# Patient Record
Sex: Female | Born: 1960 | Marital: Married | Smoking: Former smoker
Health system: Southern US, Community
[De-identification: ages and names within clinical notes are randomized; demographics above are authoritative.]

## PROBLEM LIST (undated history)

## (undated) DIAGNOSIS — M2550 Pain in unspecified joint: Secondary | ICD-10-CM

## (undated) DIAGNOSIS — K219 Gastro-esophageal reflux disease without esophagitis: Secondary | ICD-10-CM

## (undated) DIAGNOSIS — M459 Ankylosing spondylitis of unspecified sites in spine: Secondary | ICD-10-CM

## (undated) DIAGNOSIS — R1310 Dysphagia, unspecified: Secondary | ICD-10-CM

## (undated) DIAGNOSIS — I10 Essential (primary) hypertension: Secondary | ICD-10-CM

## (undated) DIAGNOSIS — M549 Dorsalgia, unspecified: Secondary | ICD-10-CM

## (undated) DIAGNOSIS — R12 Heartburn: Secondary | ICD-10-CM

## (undated) DIAGNOSIS — R9389 Abnormal findings on diagnostic imaging of other specified body structures: Secondary | ICD-10-CM

## (undated) DIAGNOSIS — Z6841 Body Mass Index (BMI) 40.0 and over, adult: Secondary | ICD-10-CM

## (undated) DIAGNOSIS — M545 Low back pain, unspecified: Secondary | ICD-10-CM

## (undated) DIAGNOSIS — M1990 Unspecified osteoarthritis, unspecified site: Secondary | ICD-10-CM

## (undated) DIAGNOSIS — Z8719 Personal history of other diseases of the digestive system: Secondary | ICD-10-CM

## (undated) DIAGNOSIS — M25569 Pain in unspecified knee: Secondary | ICD-10-CM

## (undated) HISTORY — DX: Body Mass Index (BMI) 40.0 and over, adult: Z684

## (undated) HISTORY — DX: Unspecified osteoarthritis, unspecified site: M19.90

## (undated) HISTORY — DX: Low back pain, unspecified: M54.50

## (undated) HISTORY — DX: Pain in unspecified knee: M25.569

## (undated) HISTORY — DX: Dorsalgia, unspecified: M54.9

## (undated) HISTORY — PX: UMBILICAL EXPLORATION: SHX2596

## (undated) HISTORY — PX: CHOLECYSTECTOMY: SHX55

## (undated) HISTORY — DX: Essential (primary) hypertension: I10

## (undated) HISTORY — DX: Abnormal findings on diagnostic imaging of other specified body structures: R93.89

## (undated) HISTORY — DX: Heartburn: R12

## (undated) HISTORY — DX: Pain in unspecified joint: M25.50

## (undated) HISTORY — DX: Dysphagia, unspecified: R13.10

---

## 1992-04-27 HISTORY — PX: CHOLECYSTECTOMY: SHX55

## 1999-04-28 HISTORY — PX: UMBILICAL HERNIA REPAIR: SHX196

## 2009-03-08 DIAGNOSIS — R300 Dysuria: Secondary | ICD-10-CM | POA: Insufficient documentation

## 2009-03-08 DIAGNOSIS — N921 Excessive and frequent menstruation with irregular cycle: Secondary | ICD-10-CM | POA: Insufficient documentation

## 2009-03-08 DIAGNOSIS — M1712 Unilateral primary osteoarthritis, left knee: Secondary | ICD-10-CM | POA: Insufficient documentation

## 2013-02-20 DIAGNOSIS — J309 Allergic rhinitis, unspecified: Secondary | ICD-10-CM | POA: Insufficient documentation

## 2013-02-20 DIAGNOSIS — J329 Chronic sinusitis, unspecified: Secondary | ICD-10-CM | POA: Insufficient documentation

## 2013-11-09 DIAGNOSIS — G629 Polyneuropathy, unspecified: Secondary | ICD-10-CM | POA: Insufficient documentation

## 2013-11-09 DIAGNOSIS — M545 Low back pain, unspecified: Secondary | ICD-10-CM | POA: Insufficient documentation

## 2013-11-09 DIAGNOSIS — G8929 Other chronic pain: Secondary | ICD-10-CM | POA: Insufficient documentation

## 2013-11-09 DIAGNOSIS — Z833 Family history of diabetes mellitus: Secondary | ICD-10-CM | POA: Insufficient documentation

## 2014-05-31 DIAGNOSIS — M25562 Pain in left knee: Secondary | ICD-10-CM | POA: Insufficient documentation

## 2014-05-31 DIAGNOSIS — Z634 Disappearance and death of family member: Secondary | ICD-10-CM | POA: Insufficient documentation

## 2017-03-16 DIAGNOSIS — M9902 Segmental and somatic dysfunction of thoracic region: Secondary | ICD-10-CM | POA: Diagnosis not present

## 2017-03-16 DIAGNOSIS — M9905 Segmental and somatic dysfunction of pelvic region: Secondary | ICD-10-CM | POA: Diagnosis not present

## 2017-03-16 DIAGNOSIS — M2140 Flat foot [pes planus] (acquired), unspecified foot: Secondary | ICD-10-CM | POA: Diagnosis not present

## 2017-03-16 DIAGNOSIS — M9903 Segmental and somatic dysfunction of lumbar region: Secondary | ICD-10-CM | POA: Diagnosis not present

## 2017-03-31 DIAGNOSIS — M62830 Muscle spasm of back: Secondary | ICD-10-CM | POA: Diagnosis not present

## 2017-03-31 DIAGNOSIS — M9902 Segmental and somatic dysfunction of thoracic region: Secondary | ICD-10-CM | POA: Diagnosis not present

## 2017-03-31 DIAGNOSIS — M9904 Segmental and somatic dysfunction of sacral region: Secondary | ICD-10-CM | POA: Diagnosis not present

## 2017-03-31 DIAGNOSIS — M9901 Segmental and somatic dysfunction of cervical region: Secondary | ICD-10-CM | POA: Diagnosis not present

## 2017-04-09 DIAGNOSIS — L738 Other specified follicular disorders: Secondary | ICD-10-CM | POA: Diagnosis not present

## 2017-04-09 DIAGNOSIS — L509 Urticaria, unspecified: Secondary | ICD-10-CM | POA: Diagnosis not present

## 2017-05-24 DIAGNOSIS — M9904 Segmental and somatic dysfunction of sacral region: Secondary | ICD-10-CM | POA: Diagnosis not present

## 2017-05-24 DIAGNOSIS — M5417 Radiculopathy, lumbosacral region: Secondary | ICD-10-CM | POA: Diagnosis not present

## 2017-05-24 DIAGNOSIS — M9903 Segmental and somatic dysfunction of lumbar region: Secondary | ICD-10-CM | POA: Diagnosis not present

## 2017-05-24 DIAGNOSIS — M9901 Segmental and somatic dysfunction of cervical region: Secondary | ICD-10-CM | POA: Diagnosis not present

## 2017-06-01 DIAGNOSIS — I10 Essential (primary) hypertension: Secondary | ICD-10-CM | POA: Diagnosis not present

## 2017-06-01 DIAGNOSIS — M549 Dorsalgia, unspecified: Secondary | ICD-10-CM | POA: Diagnosis not present

## 2017-06-01 DIAGNOSIS — Z1211 Encounter for screening for malignant neoplasm of colon: Secondary | ICD-10-CM | POA: Diagnosis not present

## 2017-10-15 DIAGNOSIS — Z0000 Encounter for general adult medical examination without abnormal findings: Secondary | ICD-10-CM | POA: Diagnosis not present

## 2017-10-15 DIAGNOSIS — I10 Essential (primary) hypertension: Secondary | ICD-10-CM | POA: Diagnosis not present

## 2017-10-15 DIAGNOSIS — Z136 Encounter for screening for cardiovascular disorders: Secondary | ICD-10-CM | POA: Diagnosis not present

## 2018-03-22 DIAGNOSIS — J0100 Acute maxillary sinusitis, unspecified: Secondary | ICD-10-CM | POA: Diagnosis not present

## 2018-05-04 DIAGNOSIS — Z1211 Encounter for screening for malignant neoplasm of colon: Secondary | ICD-10-CM | POA: Diagnosis not present

## 2018-05-04 DIAGNOSIS — Z6841 Body Mass Index (BMI) 40.0 and over, adult: Secondary | ICD-10-CM | POA: Diagnosis not present

## 2018-05-04 DIAGNOSIS — M549 Dorsalgia, unspecified: Secondary | ICD-10-CM | POA: Diagnosis not present

## 2018-05-04 DIAGNOSIS — I10 Essential (primary) hypertension: Secondary | ICD-10-CM | POA: Diagnosis not present

## 2018-05-23 DIAGNOSIS — Z01419 Encounter for gynecological examination (general) (routine) without abnormal findings: Secondary | ICD-10-CM | POA: Diagnosis not present

## 2018-05-23 DIAGNOSIS — Z113 Encounter for screening for infections with a predominantly sexual mode of transmission: Secondary | ICD-10-CM | POA: Diagnosis not present

## 2018-05-23 DIAGNOSIS — Z1231 Encounter for screening mammogram for malignant neoplasm of breast: Secondary | ICD-10-CM | POA: Diagnosis not present

## 2018-05-23 DIAGNOSIS — Z6841 Body Mass Index (BMI) 40.0 and over, adult: Secondary | ICD-10-CM | POA: Diagnosis not present

## 2018-05-23 DIAGNOSIS — Z1151 Encounter for screening for human papillomavirus (HPV): Secondary | ICD-10-CM | POA: Diagnosis not present

## 2018-06-06 DIAGNOSIS — N950 Postmenopausal bleeding: Secondary | ICD-10-CM | POA: Diagnosis not present

## 2018-06-06 DIAGNOSIS — R9389 Abnormal findings on diagnostic imaging of other specified body structures: Secondary | ICD-10-CM | POA: Diagnosis not present

## 2018-07-11 DIAGNOSIS — M40202 Unspecified kyphosis, cervical region: Secondary | ICD-10-CM | POA: Diagnosis not present

## 2018-07-11 DIAGNOSIS — M47812 Spondylosis without myelopathy or radiculopathy, cervical region: Secondary | ICD-10-CM | POA: Diagnosis not present

## 2018-07-11 DIAGNOSIS — M5032 Other cervical disc degeneration, mid-cervical region, unspecified level: Secondary | ICD-10-CM | POA: Diagnosis not present

## 2018-07-11 DIAGNOSIS — M4726 Other spondylosis with radiculopathy, lumbar region: Secondary | ICD-10-CM | POA: Diagnosis not present

## 2018-07-13 DIAGNOSIS — M47812 Spondylosis without myelopathy or radiculopathy, cervical region: Secondary | ICD-10-CM | POA: Diagnosis not present

## 2018-07-13 DIAGNOSIS — M4726 Other spondylosis with radiculopathy, lumbar region: Secondary | ICD-10-CM | POA: Diagnosis not present

## 2018-07-13 DIAGNOSIS — M40202 Unspecified kyphosis, cervical region: Secondary | ICD-10-CM | POA: Diagnosis not present

## 2018-07-13 DIAGNOSIS — M5032 Other cervical disc degeneration, mid-cervical region, unspecified level: Secondary | ICD-10-CM | POA: Diagnosis not present

## 2018-07-14 DIAGNOSIS — M4726 Other spondylosis with radiculopathy, lumbar region: Secondary | ICD-10-CM | POA: Diagnosis not present

## 2018-07-14 DIAGNOSIS — M5032 Other cervical disc degeneration, mid-cervical region, unspecified level: Secondary | ICD-10-CM | POA: Diagnosis not present

## 2018-07-14 DIAGNOSIS — M40202 Unspecified kyphosis, cervical region: Secondary | ICD-10-CM | POA: Diagnosis not present

## 2018-07-14 DIAGNOSIS — M47812 Spondylosis without myelopathy or radiculopathy, cervical region: Secondary | ICD-10-CM | POA: Diagnosis not present

## 2018-07-18 DIAGNOSIS — M4726 Other spondylosis with radiculopathy, lumbar region: Secondary | ICD-10-CM | POA: Diagnosis not present

## 2018-07-18 DIAGNOSIS — M5032 Other cervical disc degeneration, mid-cervical region, unspecified level: Secondary | ICD-10-CM | POA: Diagnosis not present

## 2018-07-18 DIAGNOSIS — M47812 Spondylosis without myelopathy or radiculopathy, cervical region: Secondary | ICD-10-CM | POA: Diagnosis not present

## 2018-07-18 DIAGNOSIS — M40202 Unspecified kyphosis, cervical region: Secondary | ICD-10-CM | POA: Diagnosis not present

## 2018-07-20 DIAGNOSIS — M47812 Spondylosis without myelopathy or radiculopathy, cervical region: Secondary | ICD-10-CM | POA: Diagnosis not present

## 2018-07-20 DIAGNOSIS — M5032 Other cervical disc degeneration, mid-cervical region, unspecified level: Secondary | ICD-10-CM | POA: Diagnosis not present

## 2018-07-20 DIAGNOSIS — M4726 Other spondylosis with radiculopathy, lumbar region: Secondary | ICD-10-CM | POA: Diagnosis not present

## 2018-07-20 DIAGNOSIS — M40202 Unspecified kyphosis, cervical region: Secondary | ICD-10-CM | POA: Diagnosis not present

## 2018-07-21 DIAGNOSIS — M4726 Other spondylosis with radiculopathy, lumbar region: Secondary | ICD-10-CM | POA: Diagnosis not present

## 2018-07-21 DIAGNOSIS — M47812 Spondylosis without myelopathy or radiculopathy, cervical region: Secondary | ICD-10-CM | POA: Diagnosis not present

## 2018-07-21 DIAGNOSIS — M40202 Unspecified kyphosis, cervical region: Secondary | ICD-10-CM | POA: Diagnosis not present

## 2018-07-21 DIAGNOSIS — M5032 Other cervical disc degeneration, mid-cervical region, unspecified level: Secondary | ICD-10-CM | POA: Diagnosis not present

## 2018-07-25 DIAGNOSIS — M4726 Other spondylosis with radiculopathy, lumbar region: Secondary | ICD-10-CM | POA: Diagnosis not present

## 2018-07-25 DIAGNOSIS — M40202 Unspecified kyphosis, cervical region: Secondary | ICD-10-CM | POA: Diagnosis not present

## 2018-07-25 DIAGNOSIS — M5032 Other cervical disc degeneration, mid-cervical region, unspecified level: Secondary | ICD-10-CM | POA: Diagnosis not present

## 2018-07-25 DIAGNOSIS — M47812 Spondylosis without myelopathy or radiculopathy, cervical region: Secondary | ICD-10-CM | POA: Diagnosis not present

## 2018-07-27 DIAGNOSIS — M47812 Spondylosis without myelopathy or radiculopathy, cervical region: Secondary | ICD-10-CM | POA: Diagnosis not present

## 2018-07-27 DIAGNOSIS — M4726 Other spondylosis with radiculopathy, lumbar region: Secondary | ICD-10-CM | POA: Diagnosis not present

## 2018-07-27 DIAGNOSIS — M5032 Other cervical disc degeneration, mid-cervical region, unspecified level: Secondary | ICD-10-CM | POA: Diagnosis not present

## 2018-07-27 DIAGNOSIS — M40202 Unspecified kyphosis, cervical region: Secondary | ICD-10-CM | POA: Diagnosis not present

## 2018-07-28 DIAGNOSIS — M4726 Other spondylosis with radiculopathy, lumbar region: Secondary | ICD-10-CM | POA: Diagnosis not present

## 2018-07-28 DIAGNOSIS — M5032 Other cervical disc degeneration, mid-cervical region, unspecified level: Secondary | ICD-10-CM | POA: Diagnosis not present

## 2018-07-28 DIAGNOSIS — M47812 Spondylosis without myelopathy or radiculopathy, cervical region: Secondary | ICD-10-CM | POA: Diagnosis not present

## 2018-07-28 DIAGNOSIS — M40202 Unspecified kyphosis, cervical region: Secondary | ICD-10-CM | POA: Diagnosis not present

## 2018-08-01 DIAGNOSIS — M40202 Unspecified kyphosis, cervical region: Secondary | ICD-10-CM | POA: Diagnosis not present

## 2018-08-01 DIAGNOSIS — M4726 Other spondylosis with radiculopathy, lumbar region: Secondary | ICD-10-CM | POA: Diagnosis not present

## 2018-08-01 DIAGNOSIS — M47812 Spondylosis without myelopathy or radiculopathy, cervical region: Secondary | ICD-10-CM | POA: Diagnosis not present

## 2018-08-01 DIAGNOSIS — M5032 Other cervical disc degeneration, mid-cervical region, unspecified level: Secondary | ICD-10-CM | POA: Diagnosis not present

## 2018-08-03 DIAGNOSIS — M4726 Other spondylosis with radiculopathy, lumbar region: Secondary | ICD-10-CM | POA: Diagnosis not present

## 2018-08-03 DIAGNOSIS — M47812 Spondylosis without myelopathy or radiculopathy, cervical region: Secondary | ICD-10-CM | POA: Diagnosis not present

## 2018-08-03 DIAGNOSIS — M40202 Unspecified kyphosis, cervical region: Secondary | ICD-10-CM | POA: Diagnosis not present

## 2018-08-03 DIAGNOSIS — M5032 Other cervical disc degeneration, mid-cervical region, unspecified level: Secondary | ICD-10-CM | POA: Diagnosis not present

## 2018-08-04 DIAGNOSIS — M4726 Other spondylosis with radiculopathy, lumbar region: Secondary | ICD-10-CM | POA: Diagnosis not present

## 2018-08-04 DIAGNOSIS — M47812 Spondylosis without myelopathy or radiculopathy, cervical region: Secondary | ICD-10-CM | POA: Diagnosis not present

## 2018-08-04 DIAGNOSIS — M5032 Other cervical disc degeneration, mid-cervical region, unspecified level: Secondary | ICD-10-CM | POA: Diagnosis not present

## 2018-08-04 DIAGNOSIS — M40202 Unspecified kyphosis, cervical region: Secondary | ICD-10-CM | POA: Diagnosis not present

## 2018-08-09 DIAGNOSIS — M5032 Other cervical disc degeneration, mid-cervical region, unspecified level: Secondary | ICD-10-CM | POA: Diagnosis not present

## 2018-08-09 DIAGNOSIS — M40202 Unspecified kyphosis, cervical region: Secondary | ICD-10-CM | POA: Diagnosis not present

## 2018-08-09 DIAGNOSIS — M4726 Other spondylosis with radiculopathy, lumbar region: Secondary | ICD-10-CM | POA: Diagnosis not present

## 2018-08-09 DIAGNOSIS — M47812 Spondylosis without myelopathy or radiculopathy, cervical region: Secondary | ICD-10-CM | POA: Diagnosis not present

## 2018-08-11 DIAGNOSIS — M47812 Spondylosis without myelopathy or radiculopathy, cervical region: Secondary | ICD-10-CM | POA: Diagnosis not present

## 2018-08-11 DIAGNOSIS — M5032 Other cervical disc degeneration, mid-cervical region, unspecified level: Secondary | ICD-10-CM | POA: Diagnosis not present

## 2018-08-11 DIAGNOSIS — M40202 Unspecified kyphosis, cervical region: Secondary | ICD-10-CM | POA: Diagnosis not present

## 2018-08-11 DIAGNOSIS — M4726 Other spondylosis with radiculopathy, lumbar region: Secondary | ICD-10-CM | POA: Diagnosis not present

## 2018-08-16 DIAGNOSIS — M4726 Other spondylosis with radiculopathy, lumbar region: Secondary | ICD-10-CM | POA: Diagnosis not present

## 2018-08-16 DIAGNOSIS — M5032 Other cervical disc degeneration, mid-cervical region, unspecified level: Secondary | ICD-10-CM | POA: Diagnosis not present

## 2018-08-16 DIAGNOSIS — M40202 Unspecified kyphosis, cervical region: Secondary | ICD-10-CM | POA: Diagnosis not present

## 2018-08-16 DIAGNOSIS — M47812 Spondylosis without myelopathy or radiculopathy, cervical region: Secondary | ICD-10-CM | POA: Diagnosis not present

## 2018-08-18 DIAGNOSIS — M47812 Spondylosis without myelopathy or radiculopathy, cervical region: Secondary | ICD-10-CM | POA: Diagnosis not present

## 2018-08-18 DIAGNOSIS — M40202 Unspecified kyphosis, cervical region: Secondary | ICD-10-CM | POA: Diagnosis not present

## 2018-08-18 DIAGNOSIS — M4726 Other spondylosis with radiculopathy, lumbar region: Secondary | ICD-10-CM | POA: Diagnosis not present

## 2018-08-18 DIAGNOSIS — M5032 Other cervical disc degeneration, mid-cervical region, unspecified level: Secondary | ICD-10-CM | POA: Diagnosis not present

## 2018-08-23 DIAGNOSIS — M5032 Other cervical disc degeneration, mid-cervical region, unspecified level: Secondary | ICD-10-CM | POA: Diagnosis not present

## 2018-08-23 DIAGNOSIS — M4726 Other spondylosis with radiculopathy, lumbar region: Secondary | ICD-10-CM | POA: Diagnosis not present

## 2018-08-23 DIAGNOSIS — M40202 Unspecified kyphosis, cervical region: Secondary | ICD-10-CM | POA: Diagnosis not present

## 2018-08-23 DIAGNOSIS — M47812 Spondylosis without myelopathy or radiculopathy, cervical region: Secondary | ICD-10-CM | POA: Diagnosis not present

## 2018-08-25 DIAGNOSIS — M4726 Other spondylosis with radiculopathy, lumbar region: Secondary | ICD-10-CM | POA: Diagnosis not present

## 2018-08-25 DIAGNOSIS — M47812 Spondylosis without myelopathy or radiculopathy, cervical region: Secondary | ICD-10-CM | POA: Diagnosis not present

## 2018-08-25 DIAGNOSIS — M5032 Other cervical disc degeneration, mid-cervical region, unspecified level: Secondary | ICD-10-CM | POA: Diagnosis not present

## 2018-08-25 DIAGNOSIS — M40202 Unspecified kyphosis, cervical region: Secondary | ICD-10-CM | POA: Diagnosis not present

## 2018-08-31 DIAGNOSIS — M40202 Unspecified kyphosis, cervical region: Secondary | ICD-10-CM | POA: Diagnosis not present

## 2018-08-31 DIAGNOSIS — M5032 Other cervical disc degeneration, mid-cervical region, unspecified level: Secondary | ICD-10-CM | POA: Diagnosis not present

## 2018-08-31 DIAGNOSIS — M47812 Spondylosis without myelopathy or radiculopathy, cervical region: Secondary | ICD-10-CM | POA: Diagnosis not present

## 2018-08-31 DIAGNOSIS — M4726 Other spondylosis with radiculopathy, lumbar region: Secondary | ICD-10-CM | POA: Diagnosis not present

## 2018-11-02 DIAGNOSIS — R9389 Abnormal findings on diagnostic imaging of other specified body structures: Secondary | ICD-10-CM | POA: Diagnosis not present

## 2018-11-02 DIAGNOSIS — N950 Postmenopausal bleeding: Secondary | ICD-10-CM | POA: Diagnosis not present

## 2018-11-04 DIAGNOSIS — Z0000 Encounter for general adult medical examination without abnormal findings: Secondary | ICD-10-CM | POA: Diagnosis not present

## 2019-01-30 DIAGNOSIS — T7840XA Allergy, unspecified, initial encounter: Secondary | ICD-10-CM | POA: Diagnosis not present

## 2019-01-30 DIAGNOSIS — R062 Wheezing: Secondary | ICD-10-CM | POA: Diagnosis not present

## 2019-02-24 ENCOUNTER — Other Ambulatory Visit: Payer: Self-pay

## 2019-02-24 ENCOUNTER — Encounter: Payer: Self-pay | Admitting: Allergy

## 2019-02-24 ENCOUNTER — Ambulatory Visit (INDEPENDENT_AMBULATORY_CARE_PROVIDER_SITE_OTHER): Payer: BC Managed Care – PPO | Admitting: Allergy

## 2019-02-24 VITALS — BP 132/80 | HR 88 | Temp 97.7°F | Resp 16 | Ht 62.5 in | Wt 284.2 lb

## 2019-02-24 DIAGNOSIS — H1013 Acute atopic conjunctivitis, bilateral: Secondary | ICD-10-CM | POA: Diagnosis not present

## 2019-02-24 DIAGNOSIS — J3089 Other allergic rhinitis: Secondary | ICD-10-CM | POA: Diagnosis not present

## 2019-02-24 MED ORDER — AZELASTINE HCL 0.1 % NA SOLN: 2.0000 | Freq: Two times a day (BID) | NASAL | 5 refills | Status: DC

## 2019-02-24 MED ORDER — MONTELUKAST SODIUM 10 MG PO TABS: 10.0000 mg | ORAL_TABLET | Freq: Every day | ORAL | 5 refills | Status: DC

## 2019-02-24 MED ORDER — LEVOCETIRIZINE DIHYDROCHLORIDE 5 MG PO TABS: 5.0000 mg | ORAL_TABLET | Freq: Every evening | ORAL | 5 refills | Status: DC

## 2019-02-24 MED ORDER — OLOPATADINE HCL 0.1 % OP SOLN: 1.0000 [drp] | Freq: Two times a day (BID) | OPHTHALMIC | 5 refills | Status: DC

## 2019-02-24 NOTE — Progress Notes (Signed)
New Patient Note  RE: Kelli Simmons MRN: [id number] DOB: December 18, 1960 Date of Office Visit: 02/24/2019  Referring provider: Farris HasMorrow, Aaron, MD Primary care provider: Farris HasMorrow, Aaron, MD  Chief Complaint: allergies  History of present illness: Kelli Simmons is a 58 y.o. female presenting today for consultation for allergies.    She reports feeling lightheaded and pressure in her forehead and left ear itchiness, pressure around her eyes with red eyes, fatigue, cough, some runny nose and congestion, throat clearing, sneezing.  No SOB or chest tightness.   Symptoms worse spring.  She also notes symptoms worse when around fresh cut grass as well as ragweed exposure.  She feels Dec/Jan she notices increased symptoms as well.    She has tried use of nasal saline rinse but it starting burning her nose thus she stopped.   She now just uses a nasal steam device.   She was taking nasacort while in [place] but was advised not to use due to her HTN.  She states she scaled back on using medicated nasal sprays.  She started taking supplements for sinus support and allergies which she does felt helps some but then they too stop working after a period of time.   She used to take Allegra but after a period of taking it she felt it was making her dizzy.    She recently had a course of Augmentin this fall for presumed sinus infection.    She moved to this area from [place] 2 years ago.  She states while living in [place] she did see an allergist but states she never had any testing done.  She has an albuterol inhaler but states has been advised to only use if she has coughing with a "tickle in her throat".  She states she feels like there is an "air pocket" in the back of her throat that will cause her to cough.  She states she then tries to inhale deeply to resolve the air pocket sensation but this makes her feel like she is having trouble breathing thru her nose and mouth.  She states she then needs to calm herself  down which takes several minutes to calm down.   She states she has not used the albuterol inhaler yet.  She was also prescribed an Epipen as well for these episodes.  She states the epipen was very expensive thus did not get from the pharmacy.   She stopped eating dairy, wheat and soy in the diet about a year ago.  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  She took the foods out the diet and states the rash resolved.    She is a vegetarian.   No history of eczema or asthma.     Review of systems: Review of Systems  Constitutional: Positive for fever and malaise/fatigue. Negative for chills.  HENT: Positive for congestion. Negative for ear discharge, ear pain, nosebleeds and sore throat.   Eyes: Positive for redness. Negative for pain and discharge.  Respiratory: Positive for cough. Negative for sputum production, shortness of breath and wheezing.   Cardiovascular: Negative.   Gastrointestinal: Negative.   Musculoskeletal: Negative.   Skin: Negative for itching and rash.  Neurological: Positive for dizziness and headaches.    All other systems negative unless noted above in HPI  Past medical history: Past Medical History:  Diagnosis Date   Back pain    BMI 50.0-59.9, adult (HCC)    Endometrial thickening on  ultrasound    Hypertension     Past surgical history: History reviewed. No pertinent surgical history.  Family history:  Family History  Problem Relation Age of Onset   Asthma Neg Hx    Eczema Neg Hx    Allergic Disorder Neg Hx     Social history: She lives in a home with carpeting in the bedroom with gas heating and central cooling.  There is no pets in the home.  There is no concern for water damage, mildew averages in the home.  She is unemployed. Tobacco Use   Smoking status: Former Smoker    Packs/day: 2.00    Years: 16.00    Pack years: 32.00    Types: Cigarettes    Quit date: 04/27/1988     Years since quitting: 30.8   Smokeless tobacco: Never Used    Medication List: Current Outpatient Medications  Medication Sig Dispense Refill   acetaminophen (TYLENOL) 500 MG tablet Take by mouth.     amLODipine (NORVASC) 5 MG tablet Take 5 mg by mouth daily.     Omega-3 Fatty Acids (OMEGA-3 EPA FISH OIL PO) Take by mouth.     progesterone (PROMETRIUM) 100 MG capsule Take 100 mg by mouth at bedtime.     traMADol (ULTRAM) 50 MG tablet Take 50 mg by mouth every 6 (six) hours as needed.     albuterol (VENTOLIN HFA) 108 (90 Base) MCG/ACT inhaler INHALE 1 2 PUFFS BY MOUTH AS NEEDED FOR WHEEZING EVERY 6 HRS INHALATION     No current facility-administered medications for this visit.     Known medication allergies: No Known Allergies   Physical examination: Blood pressure 132/80, pulse 88, temperature 97.7 F (36.5 C), temperature source Temporal, resp. rate 16, height 5' 2.5" (1.588 m), weight 284 lb 3.2 oz (128.9 kg), SpO2 99 %.  General: Alert, interactive, in no acute distress. HEENT: PERRLA, TMs pearly gray, turbinates moderately edematous without discharge, post-pharynx non erythematous. Neck: Supple without lymphadenopathy. Lungs: Clear to auscultation without wheezing, rhonchi or rales. {no increased work of breathing. CV: Normal S1, S2 without murmurs. Abdomen: Nondistended, nontender. Skin: Warm and dry, without lesions or rashes. Extremities:  No clubbing, cyanosis or edema. Neuro:   Grossly intact.  Diagnositics/Labs:  Allergy testing: Environmental allergy skin prick testing is positive to sycamore, walnut tree pollen, cockroach, mucor plumbeus. Intradermal testing is negative. Skin prick testing to common food allergens are negative. Allergy testing results were read and interpreted by provider, documented by clinical staff.   Assessment and plan:   Allergic rhinitis with conjunctivitis - causing significant allergy symptoms including lightheaded, dizziness,  sinus pressure, HA, itching, cough, nasal drainage, sneezing, red eyes and puffiness - common food allergy testing is negative - environmental allergy skin testing today is positive to tree pollen, mold and cockroach - allergen avoidance measures discussed/handouts provided - for control of nasal drainage recommend use of nasal antihistamine, Astelin 2 sprays each nostril twice a day.  - use nasal sprays with proper technique with pointing the tip of bottle towards the ear on same side nostril - for red/itchy/watery eyes use Patanol 1 drop each eye twice daily as needed - recommend trial of Xyzal 5mg  daily.   This is a long-acting antihistamine.  - recommend starting Singulair 10mg  daily.  This blocks leukotrienes and can provide additional allergy symptom control alongside your antihistamine.   If you note any mood or behavior changes after starting Singulair then stop this medication and symptoms return to baseline; let  us know if this occurs.   - allergen immunotherapy discussed today including protocol, benefits and risk.  Informational handout provided.  If interested in this therapuetic option you can check with your insurance carrier for coverage.  Let us know if you would like to proceed with this option.    Follow-up 4-6 months or sooner if needed   I appreciate the opportunity to take part in Shalona's care. Please do not hesitate to contact me with questions.  Sincerely,   Margo Aye, MD Allergy/Immunology [facility]

## 2019-02-24 NOTE — Patient Instructions (Addendum)
Allergies - causing significant allergy symptoms including lightheaded, dizziness, sinus pressure, HA, itching, cough, nasal drainage, sneezing, red eyes and puffiness - common food allergy testing is negative - environmental allergy skin testing today is positive to tree pollen, mold and cockroach - allergen avoidance measures discussed/handouts provided - for control of nasal drainage recommend use of nasal antihistamine, Astelin 2 sprays each nostril twice a day.  - use nasal sprays with proper technique with pointing the tip of bottle towards the ear on same side nostril - for red/itchy/watery eyes use Patanol 1 drop each eye twice daily as needed - recommend trial of Xyzal 5mg  daily.   This is a long-acting antihistamine.  - recommend starting Singulair 10mg  daily.  This blocks leukotrienes and can provide additional allergy symptom control alongside your antihistamine.   If you note any mood or behavior changes after starting Singulair then stop this medication and symptoms return to baseline; let us know if this occurs.   - allergen immunotherapy discussed today including protocol, benefits and risk.  Informational handout provided.  If interested in this therapuetic option you can check with your insurance carrier for coverage.  Let us know if you would like to proceed with this option.    Follow-up 4-6 months or sooner if needed

## 2019-02-27 NOTE — Addendum Note (Signed)
Addended by: Cathi Roan on: 02/27/2019 03:11 PM   Modules accepted: Orders

## 2019-04-06 DIAGNOSIS — I10 Essential (primary) hypertension: Secondary | ICD-10-CM | POA: Diagnosis not present

## 2019-04-06 DIAGNOSIS — Z13220 Encounter for screening for lipoid disorders: Secondary | ICD-10-CM | POA: Diagnosis not present

## 2019-04-06 DIAGNOSIS — R7309 Other abnormal glucose: Secondary | ICD-10-CM | POA: Diagnosis not present

## 2019-04-09 ENCOUNTER — Other Ambulatory Visit: Payer: Self-pay | Admitting: Allergy

## 2019-05-19 DIAGNOSIS — R0981 Nasal congestion: Secondary | ICD-10-CM | POA: Diagnosis not present

## 2019-05-19 DIAGNOSIS — Z20828 Contact with and (suspected) exposure to other viral communicable diseases: Secondary | ICD-10-CM | POA: Diagnosis not present

## 2019-05-20 DIAGNOSIS — Z20828 Contact with and (suspected) exposure to other viral communicable diseases: Secondary | ICD-10-CM | POA: Diagnosis not present

## 2019-05-25 DIAGNOSIS — I10 Essential (primary) hypertension: Secondary | ICD-10-CM | POA: Diagnosis not present

## 2019-05-25 DIAGNOSIS — M549 Dorsalgia, unspecified: Secondary | ICD-10-CM | POA: Diagnosis not present

## 2019-06-08 DIAGNOSIS — Z23 Encounter for immunization: Secondary | ICD-10-CM | POA: Diagnosis not present

## 2019-06-08 DIAGNOSIS — R7309 Other abnormal glucose: Secondary | ICD-10-CM | POA: Diagnosis not present

## 2019-06-08 DIAGNOSIS — Z13220 Encounter for screening for lipoid disorders: Secondary | ICD-10-CM | POA: Diagnosis not present

## 2019-06-08 DIAGNOSIS — I10 Essential (primary) hypertension: Secondary | ICD-10-CM | POA: Diagnosis not present

## 2019-06-21 DIAGNOSIS — Z1211 Encounter for screening for malignant neoplasm of colon: Secondary | ICD-10-CM | POA: Diagnosis not present

## 2019-06-29 ENCOUNTER — Ambulatory Visit: Payer: BC Managed Care – PPO | Admitting: Allergy

## 2019-06-30 DIAGNOSIS — Z1231 Encounter for screening mammogram for malignant neoplasm of breast: Secondary | ICD-10-CM | POA: Diagnosis not present

## 2019-06-30 DIAGNOSIS — Z01419 Encounter for gynecological examination (general) (routine) without abnormal findings: Secondary | ICD-10-CM | POA: Diagnosis not present

## 2019-06-30 DIAGNOSIS — Z6841 Body Mass Index (BMI) 40.0 and over, adult: Secondary | ICD-10-CM | POA: Diagnosis not present

## 2019-07-05 DIAGNOSIS — M9901 Segmental and somatic dysfunction of cervical region: Secondary | ICD-10-CM | POA: Diagnosis not present

## 2019-07-05 DIAGNOSIS — M9905 Segmental and somatic dysfunction of pelvic region: Secondary | ICD-10-CM | POA: Diagnosis not present

## 2019-07-05 DIAGNOSIS — M9902 Segmental and somatic dysfunction of thoracic region: Secondary | ICD-10-CM | POA: Diagnosis not present

## 2019-07-05 DIAGNOSIS — M9903 Segmental and somatic dysfunction of lumbar region: Secondary | ICD-10-CM | POA: Diagnosis not present

## 2019-07-07 DIAGNOSIS — M4316 Spondylolisthesis, lumbar region: Secondary | ICD-10-CM | POA: Diagnosis not present

## 2019-07-07 DIAGNOSIS — M549 Dorsalgia, unspecified: Secondary | ICD-10-CM | POA: Diagnosis not present

## 2019-07-07 DIAGNOSIS — M47812 Spondylosis without myelopathy or radiculopathy, cervical region: Secondary | ICD-10-CM | POA: Diagnosis not present

## 2019-07-07 DIAGNOSIS — M160 Bilateral primary osteoarthritis of hip: Secondary | ICD-10-CM | POA: Diagnosis not present

## 2019-07-07 DIAGNOSIS — M461 Sacroiliitis, not elsewhere classified: Secondary | ICD-10-CM | POA: Diagnosis not present

## 2019-07-07 DIAGNOSIS — M47819 Spondylosis without myelopathy or radiculopathy, site unspecified: Secondary | ICD-10-CM | POA: Diagnosis not present

## 2019-07-07 DIAGNOSIS — M47816 Spondylosis without myelopathy or radiculopathy, lumbar region: Secondary | ICD-10-CM | POA: Diagnosis not present

## 2019-07-07 DIAGNOSIS — M47814 Spondylosis without myelopathy or radiculopathy, thoracic region: Secondary | ICD-10-CM | POA: Diagnosis not present

## 2019-07-07 DIAGNOSIS — M2578 Osteophyte, vertebrae: Secondary | ICD-10-CM | POA: Diagnosis not present

## 2019-07-11 DIAGNOSIS — M9905 Segmental and somatic dysfunction of pelvic region: Secondary | ICD-10-CM | POA: Diagnosis not present

## 2019-07-11 DIAGNOSIS — M9901 Segmental and somatic dysfunction of cervical region: Secondary | ICD-10-CM | POA: Diagnosis not present

## 2019-07-11 DIAGNOSIS — M9902 Segmental and somatic dysfunction of thoracic region: Secondary | ICD-10-CM | POA: Diagnosis not present

## 2019-07-11 DIAGNOSIS — M9903 Segmental and somatic dysfunction of lumbar region: Secondary | ICD-10-CM | POA: Diagnosis not present

## 2019-07-17 DIAGNOSIS — M9902 Segmental and somatic dysfunction of thoracic region: Secondary | ICD-10-CM | POA: Diagnosis not present

## 2019-07-17 DIAGNOSIS — M9905 Segmental and somatic dysfunction of pelvic region: Secondary | ICD-10-CM | POA: Diagnosis not present

## 2019-07-17 DIAGNOSIS — M9903 Segmental and somatic dysfunction of lumbar region: Secondary | ICD-10-CM | POA: Diagnosis not present

## 2019-07-17 DIAGNOSIS — M9901 Segmental and somatic dysfunction of cervical region: Secondary | ICD-10-CM | POA: Diagnosis not present

## 2019-07-25 DIAGNOSIS — M9905 Segmental and somatic dysfunction of pelvic region: Secondary | ICD-10-CM | POA: Diagnosis not present

## 2019-07-25 DIAGNOSIS — M9903 Segmental and somatic dysfunction of lumbar region: Secondary | ICD-10-CM | POA: Diagnosis not present

## 2019-07-25 DIAGNOSIS — M9902 Segmental and somatic dysfunction of thoracic region: Secondary | ICD-10-CM | POA: Diagnosis not present

## 2019-07-25 DIAGNOSIS — M9901 Segmental and somatic dysfunction of cervical region: Secondary | ICD-10-CM | POA: Diagnosis not present

## 2019-08-01 DIAGNOSIS — M9901 Segmental and somatic dysfunction of cervical region: Secondary | ICD-10-CM | POA: Diagnosis not present

## 2019-08-01 DIAGNOSIS — M9903 Segmental and somatic dysfunction of lumbar region: Secondary | ICD-10-CM | POA: Diagnosis not present

## 2019-08-01 DIAGNOSIS — M9902 Segmental and somatic dysfunction of thoracic region: Secondary | ICD-10-CM | POA: Diagnosis not present

## 2019-08-01 DIAGNOSIS — M9905 Segmental and somatic dysfunction of pelvic region: Secondary | ICD-10-CM | POA: Diagnosis not present

## 2019-08-08 DIAGNOSIS — M9905 Segmental and somatic dysfunction of pelvic region: Secondary | ICD-10-CM | POA: Diagnosis not present

## 2019-08-08 DIAGNOSIS — M9902 Segmental and somatic dysfunction of thoracic region: Secondary | ICD-10-CM | POA: Diagnosis not present

## 2019-08-08 DIAGNOSIS — M9903 Segmental and somatic dysfunction of lumbar region: Secondary | ICD-10-CM | POA: Diagnosis not present

## 2019-08-08 DIAGNOSIS — M9901 Segmental and somatic dysfunction of cervical region: Secondary | ICD-10-CM | POA: Diagnosis not present

## 2019-08-15 DIAGNOSIS — M9903 Segmental and somatic dysfunction of lumbar region: Secondary | ICD-10-CM | POA: Diagnosis not present

## 2019-08-15 DIAGNOSIS — M9901 Segmental and somatic dysfunction of cervical region: Secondary | ICD-10-CM | POA: Diagnosis not present

## 2019-08-15 DIAGNOSIS — M9902 Segmental and somatic dysfunction of thoracic region: Secondary | ICD-10-CM | POA: Diagnosis not present

## 2019-08-15 DIAGNOSIS — M9905 Segmental and somatic dysfunction of pelvic region: Secondary | ICD-10-CM | POA: Diagnosis not present

## 2019-08-18 DIAGNOSIS — M25562 Pain in left knee: Secondary | ICD-10-CM | POA: Diagnosis not present

## 2019-08-18 DIAGNOSIS — M545 Low back pain: Secondary | ICD-10-CM | POA: Diagnosis not present

## 2019-08-22 DIAGNOSIS — M9903 Segmental and somatic dysfunction of lumbar region: Secondary | ICD-10-CM | POA: Diagnosis not present

## 2019-08-22 DIAGNOSIS — M9902 Segmental and somatic dysfunction of thoracic region: Secondary | ICD-10-CM | POA: Diagnosis not present

## 2019-08-22 DIAGNOSIS — M9901 Segmental and somatic dysfunction of cervical region: Secondary | ICD-10-CM | POA: Diagnosis not present

## 2019-08-22 DIAGNOSIS — M9905 Segmental and somatic dysfunction of pelvic region: Secondary | ICD-10-CM | POA: Diagnosis not present

## 2019-08-23 DIAGNOSIS — M1990 Unspecified osteoarthritis, unspecified site: Secondary | ICD-10-CM | POA: Diagnosis not present

## 2019-08-23 DIAGNOSIS — M5136 Other intervertebral disc degeneration, lumbar region: Secondary | ICD-10-CM | POA: Diagnosis not present

## 2019-08-23 DIAGNOSIS — M549 Dorsalgia, unspecified: Secondary | ICD-10-CM | POA: Diagnosis not present

## 2019-08-30 DIAGNOSIS — M9905 Segmental and somatic dysfunction of pelvic region: Secondary | ICD-10-CM | POA: Diagnosis not present

## 2019-08-30 DIAGNOSIS — M9901 Segmental and somatic dysfunction of cervical region: Secondary | ICD-10-CM | POA: Diagnosis not present

## 2019-08-30 DIAGNOSIS — M9903 Segmental and somatic dysfunction of lumbar region: Secondary | ICD-10-CM | POA: Diagnosis not present

## 2019-08-30 DIAGNOSIS — M9902 Segmental and somatic dysfunction of thoracic region: Secondary | ICD-10-CM | POA: Diagnosis not present

## 2019-09-05 DIAGNOSIS — M9902 Segmental and somatic dysfunction of thoracic region: Secondary | ICD-10-CM | POA: Diagnosis not present

## 2019-09-05 DIAGNOSIS — M9905 Segmental and somatic dysfunction of pelvic region: Secondary | ICD-10-CM | POA: Diagnosis not present

## 2019-09-05 DIAGNOSIS — M9903 Segmental and somatic dysfunction of lumbar region: Secondary | ICD-10-CM | POA: Diagnosis not present

## 2019-09-05 DIAGNOSIS — M9901 Segmental and somatic dysfunction of cervical region: Secondary | ICD-10-CM | POA: Diagnosis not present

## 2019-09-12 DIAGNOSIS — M9903 Segmental and somatic dysfunction of lumbar region: Secondary | ICD-10-CM | POA: Diagnosis not present

## 2019-09-12 DIAGNOSIS — M9901 Segmental and somatic dysfunction of cervical region: Secondary | ICD-10-CM | POA: Diagnosis not present

## 2019-09-12 DIAGNOSIS — M9905 Segmental and somatic dysfunction of pelvic region: Secondary | ICD-10-CM | POA: Diagnosis not present

## 2019-09-12 DIAGNOSIS — M9902 Segmental and somatic dysfunction of thoracic region: Secondary | ICD-10-CM | POA: Diagnosis not present

## 2019-09-19 DIAGNOSIS — M9903 Segmental and somatic dysfunction of lumbar region: Secondary | ICD-10-CM | POA: Diagnosis not present

## 2019-09-19 DIAGNOSIS — M9902 Segmental and somatic dysfunction of thoracic region: Secondary | ICD-10-CM | POA: Diagnosis not present

## 2019-09-19 DIAGNOSIS — M9905 Segmental and somatic dysfunction of pelvic region: Secondary | ICD-10-CM | POA: Diagnosis not present

## 2019-09-19 DIAGNOSIS — M9901 Segmental and somatic dysfunction of cervical region: Secondary | ICD-10-CM | POA: Diagnosis not present

## 2019-11-27 ENCOUNTER — Encounter (INDEPENDENT_AMBULATORY_CARE_PROVIDER_SITE_OTHER): Payer: Self-pay

## 2019-12-20 ENCOUNTER — Ambulatory Visit (INDEPENDENT_AMBULATORY_CARE_PROVIDER_SITE_OTHER): Payer: BC Managed Care – PPO | Admitting: Family Medicine

## 2019-12-20 ENCOUNTER — Encounter (INDEPENDENT_AMBULATORY_CARE_PROVIDER_SITE_OTHER): Payer: Self-pay | Admitting: Family Medicine

## 2019-12-20 ENCOUNTER — Other Ambulatory Visit: Payer: Self-pay

## 2019-12-20 VITALS — BP 113/75 | HR 79 | Temp 98.3°F | Ht 62.0 in | Wt 280.0 lb

## 2019-12-20 DIAGNOSIS — Z9189 Other specified personal risk factors, not elsewhere classified: Secondary | ICD-10-CM | POA: Diagnosis not present

## 2019-12-20 DIAGNOSIS — Z1331 Encounter for screening for depression: Secondary | ICD-10-CM | POA: Diagnosis not present

## 2019-12-20 DIAGNOSIS — Z0289 Encounter for other administrative examinations: Secondary | ICD-10-CM

## 2019-12-20 DIAGNOSIS — I10 Essential (primary) hypertension: Secondary | ICD-10-CM | POA: Diagnosis not present

## 2019-12-20 DIAGNOSIS — R5383 Other fatigue: Secondary | ICD-10-CM | POA: Diagnosis not present

## 2019-12-20 DIAGNOSIS — R0602 Shortness of breath: Secondary | ICD-10-CM | POA: Diagnosis not present

## 2019-12-20 DIAGNOSIS — E559 Vitamin D deficiency, unspecified: Secondary | ICD-10-CM

## 2019-12-20 DIAGNOSIS — Z6841 Body Mass Index (BMI) 40.0 and over, adult: Secondary | ICD-10-CM

## 2019-12-21 LAB — COMPREHENSIVE METABOLIC PANEL
ALT: 19 IU/L (ref 0–32)
AST: 24 IU/L (ref 0–40)
Albumin/Globulin Ratio: 1.3 (ref 1.2–2.2)
Albumin: 4.2 g/dL (ref 3.8–4.9)
Alkaline Phosphatase: 107 IU/L (ref 48–121)
BUN/Creatinine Ratio: 11 (ref 9–23)
BUN: 11 mg/dL (ref 6–24)
Bilirubin Total: 0.4 mg/dL (ref 0.0–1.2)
CO2: 26 mmol/L (ref 20–29)
Calcium: 9.7 mg/dL (ref 8.7–10.2)
Chloride: 101 mmol/L (ref 96–106)
Creatinine, Ser: 0.98 mg/dL (ref 0.57–1.00)
GFR calc Af Amer: 74 mL/min/{1.73_m2} (ref >59)
GFR calc non Af Amer: 64 mL/min/{1.73_m2} (ref >59)
Globulin, Total: 3.2 g/dL (ref 1.5–4.5)
Glucose: 95 mg/dL (ref 65–99)
Potassium: 4.4 mmol/L (ref 3.5–5.2)
Sodium: 140 mmol/L (ref 134–144)
Total Protein: 7.4 g/dL (ref 6.0–8.5)

## 2019-12-21 LAB — CBC WITH DIFFERENTIAL/PLATELET
Basophils Absolute: 0 10*3/uL (ref 0.0–0.2)
Basos: 1 %
EOS (ABSOLUTE): 0.3 10*3/uL (ref 0.0–0.4)
Eos: 6 %
Hematocrit: 45.8 % (ref 34.0–46.6)
Hemoglobin: 14.3 g/dL (ref 11.1–15.9)
Immature Grans (Abs): 0 10*3/uL (ref 0.0–0.1)
Immature Granulocytes: 0 %
Lymphocytes Absolute: 2 10*3/uL (ref 0.7–3.1)
Lymphs: 39 %
MCH: 30 pg (ref 26.6–33.0)
MCHC: 31.2 g/dL — ABNORMAL LOW (ref 31.5–35.7)
MCV: 96 fL (ref 79–97)
Monocytes Absolute: 0.4 10*3/uL (ref 0.1–0.9)
Monocytes: 7 %
Neutrophils Absolute: 2.4 10*3/uL (ref 1.4–7.0)
Neutrophils: 47 %
Platelets: 403 10*3/uL (ref 150–450)
RBC: 4.76 x10E6/uL (ref 3.77–5.28)
RDW: 12.4 % (ref 11.7–15.4)
WBC: 5 10*3/uL (ref 3.4–10.8)

## 2019-12-21 LAB — VITAMIN B12: Vitamin B-12: 1574 pg/mL — ABNORMAL HIGH (ref 232–1245)

## 2019-12-21 LAB — LIPID PANEL WITH LDL/HDL RATIO
Cholesterol, Total: 199 mg/dL (ref 100–199)
HDL: 62 mg/dL (ref >39)
LDL Chol Calc (NIH): 120 mg/dL — ABNORMAL HIGH (ref 0–99)
LDL/HDL Ratio: 1.9 ratio (ref 0.0–3.2)
Triglycerides: 96 mg/dL (ref 0–149)
VLDL Cholesterol Cal: 17 mg/dL (ref 5–40)

## 2019-12-21 LAB — VITAMIN D 25 HYDROXY (VIT D DEFICIENCY, FRACTURES): Vit D, 25-Hydroxy: 34.8 ng/mL (ref 30.0–100.0)

## 2019-12-21 LAB — INSULIN, RANDOM: INSULIN: 14.9 u[IU]/mL (ref 2.6–24.9)

## 2019-12-21 LAB — HEMOGLOBIN A1C
Est. average glucose Bld gHb Est-mCnc: 111 mg/dL
Hgb A1c MFr Bld: 5.5 % (ref 4.8–5.6)

## 2019-12-21 LAB — T3: T3, Total: 126 ng/dL (ref 71–180)

## 2019-12-21 LAB — T4: T4, Total: 8.1 ug/dL (ref 4.5–12.0)

## 2019-12-21 LAB — FOLATE: Folate: >20 ng/mL (ref >3.0)

## 2019-12-21 NOTE — Progress Notes (Signed)
Dear Kelli Has, MD,   Thank you for referring Kelli Simmons to our clinic. The following note includes my evaluation and treatment recommendations.  Chief Complaint:   OBESITY Kelli Simmons (MR# [id number]) is a 59 y.o. female who presents for evaluation and treatment of obesity and related comorbidities. Current BMI is Body mass index is 51.21 kg/m. Kelli Simmons Simmons been struggling with her weight for many years and Simmons been unsuccessful in either losing weight, maintaining weight loss, or reaching her healthy weight goal.  Kelli Simmons is currently in the action stage of change and ready to dedicate time achieving and maintaining a healthier weight. Kelli Simmons is interested in becoming our patient and working on intensive lifestyle modifications including (but not limited to) diet and exercise for weight loss.  Kelli Simmons's habits were reviewed today and are as follows: Her family eats meals together, she thinks her family will eat healthier with her, her desired weight loss is 105 lbs, she Simmons been heavy most of her life, she started gaining weight after her first child at age 73, her heaviest weight ever was 390 pounds, she is a picky eater and doesn't like to eat healthier foods, she Simmons significant food cravings issues, she snacks frequently in the evenings, she skips meals frequently, she is trying to follow a vegetarian diet, she is trying to follow a vegan diet, she is frequently drinking liquids with calories and she struggles with emotional eating.  Depression Screen Kelli Simmons Food and Mood (modified PHQ-9) score was 12.  Depression screen Kelli Simmons 2/9 12/20/2019  Decreased Interest 2  Down, Depressed, Hopeless 1  PHQ - 2 Score 3  Altered sleeping 1  Tired, decreased energy 3  Change in appetite 2  Feeling bad or failure about yourself  1  Trouble concentrating 1  Moving slowly or fidgety/restless 1  Suicidal thoughts 0  PHQ-9 Score 12  Difficult doing work/chores Somewhat difficult    Subjective:   1. Other fatigue Kelli Simmons admits to daytime somnolence and denies waking up still tired. Patent Simmons a history of symptoms of daytime fatigue. Kelli Simmons generally gets 8 or 9 hours of sleep per night, and states that she Simmons generally restful sleep. Snoring is not present. Apneic episodes are not present. Epworth Sleepiness Score is 3. EKG-normal sinus rhythm at 87 BPM.  2. SOB (shortness of breath) on exertion Kelli Simmons notes increasing shortness of breath with exercising and seems to be worsening over time with weight gain. She notes getting out of breath sooner with activity than she used to. This Simmons not gotten worse recently. Kelli Simmons denies shortness of breath at rest or orthopnea.  3. Essential hypertension Kelli Simmons's blood pressure is well controlled today. She denies chest pain, chest pressure, or headache.  4. Vitamin D deficiency Kelli Simmons denies nausea, vomiting, or muscle weakness, but she notes fatigue. She is on OTC Vit D daily.  5. At risk for osteoporosis Kelli Simmons is at higher risk of osteopenia and osteoporosis due to Vitamin D deficiency.   Assessment/Plan:   1. Other fatigue Kelli Simmons does feel that her weight is causing her energy to be lower than it should be. Fatigue may be related to obesity, depression or many other causes. Labs will be ordered, and in the meanwhile, Kelli Simmons will focus on self care including making healthy food choices, increasing physical activity and focusing on stress reduction.  - EKG 12-Lead - Vitamin B12 - Folate - Hemoglobin A1c - Insulin, random - T3 - T4  2. SOB (shortness of breath)  on exertion Kelli Simmons does feel that she gets out of breath more easily that she used to when she exercises. Kelli Simmons's shortness of breath appears to be obesity related and exercise induced. She Simmons agreed to work on weight loss and gradually increase exercise to treat her exercise induced shortness of breath. Will continue to monitor closely.  - CBC with  Differential/Platelet  3. Essential hypertension Kelli Simmons is working on healthy weight loss and exercise to improve blood pressure control. We will watch for signs of hypotension as she continues her lifestyle modifications. We will check labs today.  - Comprehensive metabolic panel - Lipid Panel With LDL/HDL Ratio  4. Vitamin D deficiency Low Vitamin D level contributes to fatigue and are associated with obesity, breast, and colon cancer. We will check labs today. Kelli Simmons will follow-up for routine testing of Vitamin D, at least 2-3 times per year to avoid over-replacement.  - VITAMIN D 25 Hydroxy (Vit-D Deficiency, Fractures)  5. Depression screening Kelli Simmons had a positive depression screening. Depression is commonly associated with obesity and often results in emotional eating behaviors. We will monitor this closely and work on CBT to help improve the non-hunger eating patterns. Referral to Psychology may be required if no improvement is seen as she continues in our clinic.  6. At risk for osteoporosis Kelli Simmons was given approximately 15 minutes of osteoporosis prevention counseling today. Kelli Simmons is at risk for osteopenia and osteoporosis due to her Vitamin D deficiency. She was encouraged to take her Vitamin D and follow her higher calcium diet and increase strengthening exercise to help strengthen her bones and decrease her risk of osteopenia and osteoporosis.  Repetitive spaced learning was employed today to elicit superior memory formation and behavioral change.  7. Class 3 severe obesity with serious comorbidity and body mass index (BMI) of 50.0 to 59.9 in adult, unspecified obesity type (HCC) Kelli Simmons is currently in the action stage of change and her goal is to continue with weight loss efforts. I recommend Kelli Simmons begin the structured treatment plan as follows:  She Simmons agreed to keeping a food journal and adhering to recommended goals of 1150-1250 calories and 80+ grams of protein  daily.  Exercise goals: As is.   Behavioral modification strategies: increasing lean protein intake, meal planning and cooking strategies, keeping healthy foods in the home and planning for success.  She was informed of the importance of frequent follow-up visits to maximize her success with intensive lifestyle modifications for her multiple health conditions. She was informed we would discuss her lab results at her next visit unless there is a critical issue that needs to be addressed sooner. Kelli Simmons agreed to keep her next visit at the agreed upon time to discuss these results.  Objective:   Blood pressure 113/75, pulse 79, temperature 98.3 F (36.8 C), temperature source Oral, height 5\' 2"  (1.575 m), weight 280 lb (127 kg), SpO2 98 %. Body mass index is 51.21 kg/m.  EKG: Normal sinus rhythm, rate 87 BPM.  Indirect Calorimeter completed today shows a VO2 of 217 and a REE of 1511.  Her calculated basal metabolic rate is thus her basal metabolic rate is worse than expected.  General: Cooperative, alert, well developed, in no acute distress. HEENT: Conjunctivae and lids unremarkable. Cardiovascular: Regular rhythm.  Lungs: Normal work of breathing. Neurologic: No focal deficits.   Lab Results  Component Value Date   CREATININE 0.98 12/20/2019   BUN 11 12/20/2019   NA 140 12/20/2019   K 4.4 12/20/2019  CL 101 12/20/2019   CO2 26 12/20/2019   Lab Results  Component Value Date   ALT 19 12/20/2019   AST 24 12/20/2019   ALKPHOS 107 12/20/2019   BILITOT 0.4 12/20/2019   Lab Results  Component Value Date   HGBA1C 5.5 12/20/2019   Lab Results  Component Value Date   INSULIN 14.9 12/20/2019   No results found for: TSH Lab Results  Component Value Date   CHOL 199 12/20/2019   HDL 62 12/20/2019   LDLCALC 120 (H) 12/20/2019   TRIG 96 12/20/2019   Lab Results  Component Value Date   WBC 5.0 12/20/2019   HGB 14.3 12/20/2019   HCT 45.8 12/20/2019   MCV 96 12/20/2019    PLT 403 12/20/2019   No results found for: IRON, TIBC, FERRITIN  Attestation Statements:  This is the patient's first visit at [facility]. The patient's NEW PATIENT PACKET was reviewed at length. Included in the packet: current and past health history, medications, allergies, ROS, gynecologic history (women only), surgical history, family history, social history, weight history, weight loss surgery history (for those that have had weight loss surgery), nutritional evaluation, mood and food questionnaire, PHQ9, Epworth questionnaire, sleep habits questionnaire, patient life and health improvement goals questionnaire. These will all be scanned into the patient's chart under media.   During the visit, I independently reviewed the patient's EKG, bioimpedance scale results, and indirect calorimeter results. I used this information to tailor a meal plan for the patient that will help her to lose weight and will improve her obesity-related conditions going forward. I performed a medically necessary appropriate examination and/or evaluation. I discussed the assessment and treatment plan with the patient. The patient was provided an opportunity to ask questions and all were answered. The patient agreed with the plan and demonstrated an understanding of the instructions. Labs were ordered at this visit and will be reviewed at the next visit unless more critical results need to be addressed immediately. Clinical information was updated and documented in the EMR.   Time spent on visit including pre-visit chart review and post-visit care was 45 minutes.   A separate 15 minutes was spent on risk counseling (see above).    I, Burt Knack, am acting as transcriptionist for Reuben Likes, MD.  I have reviewed the above documentation for accuracy and completeness, and I agree with the above. - Katherina Mires, MD

## 2020-01-03 ENCOUNTER — Other Ambulatory Visit: Payer: Self-pay

## 2020-01-03 ENCOUNTER — Encounter (INDEPENDENT_AMBULATORY_CARE_PROVIDER_SITE_OTHER): Payer: Self-pay | Admitting: Family Medicine

## 2020-01-03 ENCOUNTER — Ambulatory Visit (INDEPENDENT_AMBULATORY_CARE_PROVIDER_SITE_OTHER): Payer: BC Managed Care – PPO | Admitting: Family Medicine

## 2020-01-03 VITALS — BP 151/70 | HR 77 | Temp 98.1°F | Ht 62.0 in | Wt 282.0 lb

## 2020-01-03 DIAGNOSIS — Z9189 Other specified personal risk factors, not elsewhere classified: Secondary | ICD-10-CM

## 2020-01-03 DIAGNOSIS — E7849 Other hyperlipidemia: Secondary | ICD-10-CM

## 2020-01-03 DIAGNOSIS — Z6841 Body Mass Index (BMI) 40.0 and over, adult: Secondary | ICD-10-CM

## 2020-01-03 DIAGNOSIS — E8881 Metabolic syndrome: Secondary | ICD-10-CM | POA: Diagnosis not present

## 2020-01-03 DIAGNOSIS — E559 Vitamin D deficiency, unspecified: Secondary | ICD-10-CM

## 2020-01-03 MED ORDER — WEGOVY 0.25 MG/0.5ML ~~LOC~~ SOAJ: 0.2500 mg | SUBCUTANEOUS | 0 refills | Status: DC

## 2020-01-03 MED ORDER — VITAMIN D (ERGOCALCIFEROL) 1.25 MG (50000 UNIT) PO CAPS: 50000.0000 [IU] | ORAL_CAPSULE | ORAL | 0 refills | Status: DC

## 2020-01-04 ENCOUNTER — Other Ambulatory Visit: Payer: Self-pay | Admitting: Allergy

## 2020-01-04 NOTE — Progress Notes (Signed)
Chief Complaint:    OBESITY Kelli Simmons is here to discuss her progress with her obesity treatment plan along with follow-up of her obesity related diagnoses. Kelli Simmons is on keeping a food journal and adhering to recommended goals of 1150-1250 calories and 80+ grams of protein daily and states she is following her eating plan approximately 50% of the time. Kelli Simmons states she is at the gym doing strengthening for 2 hours 3 times per week, and yoga for 45 minutes 2 times per week.  Today's visit was #: 2 Starting weight: 280 lbs Starting date: 12/20/2019 Today's weight: 282 lbs Today's date: 01/03/2020 Total lbs lost to date: 0 Total lbs lost since last in-office visit: 0  Interim History: Kelli Simmons is getting in about 70 grams of protein daily, and staying around 1,200 calories daily. She voices that she doesn't consistently journal but she does enjoy that she is increasing her nutrient intake. The hardest part is fitting food in during the day. She is drinking 2 shakes per day. She finds herself feeling full most of the day.  Subjective:   1. Other hyperlipidemia Kelli Simmons's last LDL was 120, HDL 62, and triglycerides 96. She is not on statin. I discussed labs with the patient today.  2. Vitamin D deficiency Kelli Simmons denies nausea, vomiting, or muscle weakness, but she notes fatigue. She is not on Vit D. I discussed labs with the patient today.  3. Insulin resistance Kelli Simmons's last A1c was 5.5 and insulin 14.9. No prior history of insulin resistance. I discussed labs with the patient today.  4. At risk for diabetes mellitus Kelli Simmons is at higher than average risk for developing diabetes due to her obesity.   Assessment/Plan:   1. Other hyperlipidemia Cardiovascular risk and specific lipid/LDL goals reviewed. We discussed several lifestyle modifications today and Kelli Simmons will continue to work on diet, exercise and weight loss efforts. We will repeat labs in 3 months, and repeat risk stratification in  3 months. Orders and follow up as documented in patient record.   Counseling Intensive lifestyle modifications are the first line treatment for this issue. . Dietary changes: Increase soluble fiber. Decrease simple carbohydrates. . Exercise changes: Moderate to vigorous-intensity aerobic activity 150 minutes per week if tolerated. . Lipid-lowering medications: see documented in medical record.  2. Vitamin D deficiency Low Vitamin D level contributes to fatigue and are associated with obesity, breast, and colon cancer. Kelli Simmons agreed to start prescription Vitamin D 50,000 IU every week with no refills. She will follow-up for routine testing of Vitamin D, at least 2-3 times per year to avoid over-replacement.  - Vitamin D, Ergocalciferol, (DRISDOL) 1.25 MG (50000 UNIT) CAPS capsule; Take 1 capsule (50,000 Units total) by mouth every 7 (seven) days.  Dispense: 4 capsule; Refill: 0  3. Insulin resistance Kelli Simmons will continue to work on weight loss, exercise, and decreasing simple carbohydrates to help decrease the risk of diabetes. We will repeat labs in 3 months. She is to continue working on journaling daily. Kelli Simmons agreed to follow-up with [name] as directed to closely monitor her progress.  4. At risk for diabetes mellitus Kelli Simmons was given approximately 30 minutes of diabetes education and counseling today. We discussed intensive lifestyle modifications today with an emphasis on weight loss as well as increasing exercise and decreasing simple carbohydrates in her diet. We also reviewed medication options with an emphasis on risk versus benefit of those discussed.   Repetitive spaced learning was employed today to elicit superior memory formation and behavioral  change.  5. Class 3 severe obesity with serious comorbidity and body mass index (BMI) of 50.0 to 59.9 in adult, unspecified obesity type (Kelli Simmons) Kelli Simmons is currently in the action stage of change. As such, her goal is to continue with weight loss  efforts. She has agreed to keeping a food journal and adhering to recommended goals of 1150-1250 calories and 80+ grams of protein daily.   We discussed various medication options to help Kelli Simmons with her weight loss efforts and we both agreed to start Wegovy at 0.25 mg SubQ weekly with no refills. (No history of pancreatitis).  - Semaglutide-Weight Management (WEGOVY) 0.25 MG/0.5ML SOAJ; Inject 0.5 mLs (0.25 mg total) into the skin once a week.  Dispense: 2 mL; Refill: 0  Exercise goals: All adults should avoid inactivity. Some physical activity is better than none, and adults who participate in any amount of physical activity gain some health benefits.  Behavioral modification strategies: increasing lean protein intake, meal planning and cooking strategies, keeping healthy foods in the home and planning for success.  Kelli Simmons has agreed to follow-up with our clinic in 2 weeks. She was informed of the importance of frequent follow-up visits to maximize her success with intensive lifestyle modifications for her multiple health conditions.   Objective:   Blood pressure (!) 151/70, pulse 77, temperature 98.1 F (36.7 C), temperature source Oral, height 5\' 2"  (1.575 m), weight 282 lb (127.9 kg), SpO2 100 %. Body mass index is 51.58 kg/m.  General: Cooperative, alert, well developed, in no acute distress. HEENT: Conjunctivae and lids unremarkable. Cardiovascular: Regular rhythm.  Lungs: Normal work of breathing. Neurologic: No focal deficits.   Lab Results  Component Value Date   CREATININE 0.98 12/20/2019   BUN 11 12/20/2019   NA 140 12/20/2019   K 4.4 12/20/2019   CL 101 12/20/2019   CO2 26 12/20/2019   Lab Results  Component Value Date   ALT 19 12/20/2019   AST 24 12/20/2019   ALKPHOS 107 12/20/2019   BILITOT 0.4 12/20/2019   Lab Results  Component Value Date   HGBA1C 5.5 12/20/2019   Lab Results  Component Value Date   INSULIN 14.9 12/20/2019   No results found for:  TSH Lab Results  Component Value Date   CHOL 199 12/20/2019   HDL 62 12/20/2019   LDLCALC 120 (H) 12/20/2019   TRIG 96 12/20/2019   Lab Results  Component Value Date   WBC 5.0 12/20/2019   HGB 14.3 12/20/2019   HCT 45.8 12/20/2019   MCV 96 12/20/2019   PLT 403 12/20/2019   No results found for: IRON, TIBC, FERRITIN  Attestation Statements:   Reviewed by clinician on day of visit: allergies, medications, problem list, medical history, surgical history, family history, social history, and previous encounter notes.   I, 12/22/2019, am acting as transcriptionist for Burt Knack, MD.  I have reviewed the above documentation for accuracy and completeness, and I agree with the above. - Reuben Likes, MD

## 2020-01-06 ENCOUNTER — Encounter (INDEPENDENT_AMBULATORY_CARE_PROVIDER_SITE_OTHER): Payer: Self-pay | Admitting: Family Medicine

## 2020-01-08 ENCOUNTER — Other Ambulatory Visit (INDEPENDENT_AMBULATORY_CARE_PROVIDER_SITE_OTHER): Payer: Self-pay

## 2020-01-08 MED ORDER — WEGOVY 0.25 MG/0.5ML ~~LOC~~ SOAJ: 0.2500 mg | SUBCUTANEOUS | 0 refills | Status: DC

## 2020-01-17 ENCOUNTER — Encounter (INDEPENDENT_AMBULATORY_CARE_PROVIDER_SITE_OTHER): Payer: Self-pay | Admitting: Family Medicine

## 2020-01-17 NOTE — Telephone Encounter (Signed)
Please advise 

## 2020-01-18 ENCOUNTER — Ambulatory Visit (INDEPENDENT_AMBULATORY_CARE_PROVIDER_SITE_OTHER): Payer: BC Managed Care – PPO | Admitting: Family Medicine

## 2020-01-22 ENCOUNTER — Encounter (INDEPENDENT_AMBULATORY_CARE_PROVIDER_SITE_OTHER): Payer: Self-pay | Admitting: Family Medicine

## 2020-01-22 NOTE — Telephone Encounter (Signed)
Please advise 

## 2020-01-23 NOTE — Telephone Encounter (Signed)
Please advise 

## 2020-01-23 NOTE — Telephone Encounter (Signed)
Fyi.

## 2020-01-25 ENCOUNTER — Ambulatory Visit (INDEPENDENT_AMBULATORY_CARE_PROVIDER_SITE_OTHER): Payer: BC Managed Care – PPO | Admitting: Family Medicine

## 2020-01-25 ENCOUNTER — Encounter (INDEPENDENT_AMBULATORY_CARE_PROVIDER_SITE_OTHER): Payer: Self-pay | Admitting: Bariatrics

## 2020-01-25 ENCOUNTER — Ambulatory Visit (INDEPENDENT_AMBULATORY_CARE_PROVIDER_SITE_OTHER): Payer: BC Managed Care – PPO | Admitting: Bariatrics

## 2020-01-25 ENCOUNTER — Other Ambulatory Visit (INDEPENDENT_AMBULATORY_CARE_PROVIDER_SITE_OTHER): Payer: Self-pay | Admitting: Family Medicine

## 2020-01-25 ENCOUNTER — Other Ambulatory Visit: Payer: Self-pay

## 2020-01-25 VITALS — BP 143/95 | HR 85 | Temp 98.1°F | Ht 62.0 in | Wt 282.0 lb

## 2020-01-25 DIAGNOSIS — Z6841 Body Mass Index (BMI) 40.0 and over, adult: Secondary | ICD-10-CM | POA: Diagnosis not present

## 2020-01-25 DIAGNOSIS — M545 Low back pain, unspecified: Secondary | ICD-10-CM

## 2020-01-25 DIAGNOSIS — E559 Vitamin D deficiency, unspecified: Secondary | ICD-10-CM

## 2020-01-25 DIAGNOSIS — Z9189 Other specified personal risk factors, not elsewhere classified: Secondary | ICD-10-CM | POA: Diagnosis not present

## 2020-01-25 DIAGNOSIS — I10 Essential (primary) hypertension: Secondary | ICD-10-CM | POA: Diagnosis not present

## 2020-01-25 MED ORDER — MELOXICAM 7.5 MG PO TABS: 7.5000 mg | ORAL_TABLET | Freq: Every day | ORAL | 0 refills | Status: DC

## 2020-01-25 MED ORDER — WEGOVY 0.5 MG/0.5ML ~~LOC~~ SOAJ: 0.5000 mg | SUBCUTANEOUS | 0 refills | Status: DC

## 2020-01-25 NOTE — Progress Notes (Signed)
Chief Complaint:   OBESITY Kelli Simmons is here to discuss her progress with her obesity treatment plan along with follow-up of her obesity related diagnoses. Kelli Simmons is on keeping a food journal and adhering to recommended goals of 1150-1250 calories and 80+ grams of protein daily and states she is following her eating plan approximately 80% of the time. Kelli Simmons states she is at the gym for 120 minutes 3 times per week.  Today's visit was #: 3 Starting weight: 280 lbs Starting date: 12/20/2019 Today's weight: 282 lbs Today's date: 01/25/2020 Total lbs lost to date: 0 Total lbs lost since last in-office visit: 0  Interim History: Kelli Simmons's weight remains the same. She is taking [place] and she wants to continues. She is doing well with her protein and water.  Subjective:   1. Essential hypertension Kelli Simmons is taking Norvasc and her blood pressure is slightly elevated at 143/95.   2. Low back pain, unspecified back pain laterality, unspecified chronicity, unspecified whether sciatica present Kelli Simmons notes back pain, and notes the pain is consistent.  3. At risk for activity intolerance Kelli Simmons is at risk for exercise intolerance due to arthritis.  Assessment/Plan:   1. Essential hypertension Ahyana is working on healthy weight loss and exercise to improve blood pressure control. We will watch for signs of hypotension as she continues her lifestyle modifications. Taite will continue her medications, no change at this times.  2. Low back pain, unspecified back pain laterality, unspecified chronicity, unspecified whether sciatica present Kelli Simmons agreed to discontinue Relafen and will start Mobic 7.5 mg once daily with no refills.  - meloxicam (MOBIC) 7.5 MG tablet; Take 1 tablet (7.5 mg total) by mouth daily.  Dispense: 30 tablet; Refill: 0  3. At risk for activity intolerance Kelli Simmons was given approximately 15 minutes of exercise intolerance counseling today. She is 59 y.o. female and has  risk factors exercise intolerance including obesity. We discussed intensive lifestyle modifications today with an emphasis on specific weight loss instructions and strategies. Kelli Simmons will slowly increase activity as tolerated.  Repetitive spaced learning was employed today to elicit superior memory formation and behavioral change.  4. Class 3 severe obesity with serious comorbidity and body mass index (BMI) of 50.0 to 59.9 in adult, unspecified obesity type (HCC) Kelli Simmons is currently in the action stage of change. As such, her goal is to continue with weight loss efforts. She has agreed to keeping a food journal and adhering to recommended goals of 1150-1250 calories and 90 grams of protein daily.   We discussed various medication options to help Kelli Simmons with her weight loss efforts and we both agreed to continue [place], and we will refill for 1 month.  - Semaglutide-Weight Management (WEGOVY) 0.5 MG/0.5ML SOAJ; Inject 0.5 mg into the skin once a week.  Dispense: 2 mL; Refill: 0  Exercise goals: As is.  Behavioral modification strategies: increasing lean protein intake, decreasing simple carbohydrates, increasing vegetables, increasing water intake, decreasing eating out, no skipping meals, meal planning and cooking strategies, keeping healthy foods in the home and planning for success.  Kelli Simmons has agreed to follow-up with our clinic in 2 weeks with Dr. Lawson Radar. She was informed of the importance of frequent follow-up visits to maximize her success with intensive lifestyle modifications for her multiple health conditions.   Objective:   Blood pressure (!) 143/95, pulse 85, temperature 98.1 F (36.7 C), height 5\' 2"  (1.575 m), weight 282 lb (127.9 kg), SpO2 99 %. Body mass index is 51.58 kg/m.  General: Cooperative, alert, well developed, in no acute distress. HEENT: Conjunctivae and lids unremarkable. Cardiovascular: Regular rhythm.  Lungs: Normal work of breathing. Neurologic: No focal  deficits.   Lab Results  Component Value Date   CREATININE 0.98 12/20/2019   BUN 11 12/20/2019   NA 140 12/20/2019   K 4.4 12/20/2019   CL 101 12/20/2019   CO2 26 12/20/2019   Lab Results  Component Value Date   ALT 19 12/20/2019   AST 24 12/20/2019   ALKPHOS 107 12/20/2019   BILITOT 0.4 12/20/2019   Lab Results  Component Value Date   HGBA1C 5.5 12/20/2019   Lab Results  Component Value Date   INSULIN 14.9 12/20/2019   No results found for: TSH Lab Results  Component Value Date   CHOL 199 12/20/2019   HDL 62 12/20/2019   LDLCALC 120 (H) 12/20/2019   TRIG 96 12/20/2019   Lab Results  Component Value Date   WBC 5.0 12/20/2019   HGB 14.3 12/20/2019   HCT 45.8 12/20/2019   MCV 96 12/20/2019   PLT 403 12/20/2019   No results found for: IRON, TIBC, FERRITIN  Attestation Statements:   Reviewed by clinician on day of visit: allergies, medications, problem list, medical history, surgical history, family history, social history, and previous encounter notes.   Trude Mcburney, am acting as Energy manager for [employer], DO.  I have reviewed the above documentation for accuracy and completeness, and I agree with the above. Corinna Capra, DO

## 2020-01-27 ENCOUNTER — Other Ambulatory Visit: Payer: Self-pay | Admitting: Allergy

## 2020-02-13 ENCOUNTER — Ambulatory Visit (INDEPENDENT_AMBULATORY_CARE_PROVIDER_SITE_OTHER): Payer: BC Managed Care – PPO | Admitting: Family Medicine

## 2020-02-13 ENCOUNTER — Other Ambulatory Visit: Payer: Self-pay

## 2020-02-13 ENCOUNTER — Encounter (INDEPENDENT_AMBULATORY_CARE_PROVIDER_SITE_OTHER): Payer: Self-pay | Admitting: Family Medicine

## 2020-02-13 VITALS — BP 123/76 | HR 78 | Temp 98.3°F | Ht 62.0 in | Wt 279.0 lb

## 2020-02-13 DIAGNOSIS — E559 Vitamin D deficiency, unspecified: Secondary | ICD-10-CM

## 2020-02-13 DIAGNOSIS — Z9189 Other specified personal risk factors, not elsewhere classified: Secondary | ICD-10-CM

## 2020-02-13 DIAGNOSIS — K219 Gastro-esophageal reflux disease without esophagitis: Secondary | ICD-10-CM

## 2020-02-13 DIAGNOSIS — Z6841 Body Mass Index (BMI) 40.0 and over, adult: Secondary | ICD-10-CM | POA: Diagnosis not present

## 2020-02-13 MED ORDER — VITAMIN D (ERGOCALCIFEROL) 1.25 MG (50000 UNIT) PO CAPS: 50000.0000 [IU] | ORAL_CAPSULE | ORAL | 0 refills | Status: DC

## 2020-02-13 MED ORDER — WEGOVY 1 MG/0.5ML ~~LOC~~ SOAJ: 1.0000 mg | SUBCUTANEOUS | 0 refills | Status: DC

## 2020-02-13 MED ORDER — OMEPRAZOLE 20 MG PO CPDR: 20.0000 mg | DELAYED_RELEASE_CAPSULE | Freq: Every day | ORAL | 3 refills | Status: DC

## 2020-02-20 NOTE — Progress Notes (Signed)
Chief Complaint:   OBESITY Kelli Simmons is here to discuss her progress with her obesity treatment plan along with follow-up of her obesity related diagnoses. [name] is keeping a food journal and adhering to recommended goals of 1150-1250 calories and 80+ grams of protein and states she is following her eating plan approximately 60-70% of the time. [name] states she is doing yoga 45 minutes 2 times per week and strengthening and cardio 3 times per week.  Today's visit was #: 4 Starting weight: 280 lbs Starting date: 12/20/2019 Today's weight: 279 lbs Today's date: 02/13/2020 Total lbs lost to date: 1 Total lbs lost since last in-office visit: 3  Interim History: [name] voices that the last few weeks she is tracking 70-80% of the time. She reports that when she isn't getting enough protein she notices she feels drained. She reports maybe experiencing some increase in GERD with Wegovy and is noticing she feels fuller fast.  Subjective:   Vitamin D deficiency. No nausea, vomiting, or muscle weakness, but endorses fatigue. [name] is on prescription Vitamin D supplementation. Last Vitamin D level was 34.8.   Ref. Range 12/20/2019 11:03  Vitamin D, 25-Hydroxy Latest Ref Range: 30.0 - 100.0 ng/mL 34.8   Gastroesophageal reflux disease, unspecified whether esophagitis present. Reflux is reasonably well controlled when on omeprazole; omeprazole improved symptoms previously.  At risk for side effect of medication. Silver is at risk of side effect with increased dose of Wegovy.  Assessment/Plan:   Vitamin D deficiency. Low Vitamin D level contributes to fatigue and are associated with obesity, breast, and colon cancer. She was given a refill on her Vitamin D, Ergocalciferol, (DRISDOL) 1.25 MG (50000 UNIT) CAPS capsule every week #4 with 0 refills and will follow-up for routine testing of Vitamin D, at least 2-3 times per year to avoid over-replacement.   Gastroesophageal reflux disease,  unspecified whether esophagitis present. Intensive lifestyle modifications are the first line treatment for this issue. We discussed several lifestyle modifications today and she will continue to work on diet, exercise and weight loss efforts. Orders and follow up as documented in patient record. [name] will restart omeprazole (PRILOSEC) 20 MG capsule PO daily #30 with 0 refills.  Counseling . If a person has gastroesophageal reflux disease (GERD), food and stomach acid move back up into the esophagus and cause symptoms or problems such as damage to the esophagus. . Anti-reflux measures include: raising the head of the bed, avoiding tight clothing or belts, avoiding eating late at night, not lying down shortly after mealtime, and achieving weight loss. . Avoid ASA, NSAID's, caffeine, alcohol, and tobacco.  . OTC Pepcid and/or Tums are often very helpful for as needed use.  Marland Kitchen However, for persisting chronic or daily symptoms, stronger medications like Omeprazole may be needed. . You may need to avoid foods and drinks such as: ? Coffee and tea (with or without caffeine). ? Drinks that contain alcohol. ? Energy drinks and sports drinks. ? Bubbly (carbonated) drinks or sodas. ? Chocolate and cocoa. ? Peppermint and mint flavorings. ? Garlic and onions. ? Horseradish. ? Spicy and acidic foods. These include peppers, chili powder, curry powder, vinegar, hot sauces, and BBQ sauce. ? Citrus fruit juices and citrus fruits, such as oranges, lemons, and limes. ? Tomato-based foods. These include red sauce, chili, salsa, and pizza with red sauce. ? Fried and fatty foods. These include donuts, french fries, potato chips, and high-fat dressings. ? High-fat meats. These include hot dogs, rib eye steak, sausage,  ham, and bacon.   At risk for side effect of medication. [name] was given approximately 15 minutes of drug side effect counseling today.  We discussed side effect possibility and risk versus benefits.  [name] agreed to the medication and will contact this office if these side effects are intolerable.  Repetitive spaced learning was employed today to elicit superior memory formation and behavioral change.  Class 3 severe obesity with serious comorbidity and body mass index (BMI) of 50.0 to 59.9 in adult, unspecified obesity type (HCC). [name] will increase Semaglutide-Weight Management (WEGOVY) 1 MG/0.5ML SOAJ to 1 mg SQ weekly #2 mL with 0 refills.  [name] is currently in the action stage of change. As such, her goal is to continue with weight loss efforts. She has agreed to keeping a food journal and adhering to recommended goals of 1150-1250 calories and 80+ grams of protein daily.   Exercise goals: [name] will continue her current exercise regimen.   Behavioral modification strategies: increasing lean protein intake, planning for success and keeping a strict food journal.  [name] has agreed to follow-up with our clinic in 2-3 weeks. She was informed of the importance of frequent follow-up visits to maximize her success with intensive lifestyle modifications for her multiple health conditions.   Objective:   Blood pressure 123/76, pulse 78, temperature 98.3 F (36.8 C), temperature source Oral, height 5\' 2"  (1.575 m), weight 279 lb (126.6 kg), SpO2 100 %. Body mass index is 51.03 kg/m.  General: Cooperative, alert, well developed, in no acute distress. HEENT: Conjunctivae and lids unremarkable. Cardiovascular: Regular rhythm.  Lungs: Normal work of breathing. Neurologic: No focal deficits.   Lab Results  Component Value Date   CREATININE 0.98 12/20/2019   BUN 11 12/20/2019   NA 140 12/20/2019   K 4.4 12/20/2019   CL 101 12/20/2019   CO2 26 12/20/2019   Lab Results  Component Value Date   ALT 19 12/20/2019   AST 24 12/20/2019   ALKPHOS 107 12/20/2019   BILITOT 0.4 12/20/2019   Lab Results  Component Value Date   HGBA1C 5.5 12/20/2019   Lab Results  Component Value  Date   INSULIN 14.9 12/20/2019   No results found for: TSH Lab Results  Component Value Date   CHOL 199 12/20/2019   HDL 62 12/20/2019   LDLCALC 120 (H) 12/20/2019   TRIG 96 12/20/2019   Lab Results  Component Value Date   WBC 5.0 12/20/2019   HGB 14.3 12/20/2019   HCT 45.8 12/20/2019   MCV 96 12/20/2019   PLT 403 12/20/2019   No results found for: IRON, TIBC, FERRITIN  Attestation Statements:   Reviewed by clinician on day of visit: allergies, medications, problem list, medical history, surgical history, family history, social history, and previous encounter notes.  I, 12/22/2019, am acting as transcriptionist for Marianna Payment, MD   I have reviewed the above documentation for accuracy and completeness, and I agree with the above. - Reuben Likes, MD

## 2020-02-28 ENCOUNTER — Ambulatory Visit (INDEPENDENT_AMBULATORY_CARE_PROVIDER_SITE_OTHER): Payer: BC Managed Care – PPO | Admitting: Family Medicine

## 2020-02-28 ENCOUNTER — Encounter (INDEPENDENT_AMBULATORY_CARE_PROVIDER_SITE_OTHER): Payer: Self-pay | Admitting: Family Medicine

## 2020-02-28 ENCOUNTER — Other Ambulatory Visit: Payer: Self-pay

## 2020-02-28 VITALS — BP 119/75 | HR 79 | Temp 98.2°F | Ht 62.0 in | Wt 277.0 lb

## 2020-02-28 DIAGNOSIS — Z9189 Other specified personal risk factors, not elsewhere classified: Secondary | ICD-10-CM | POA: Diagnosis not present

## 2020-02-28 DIAGNOSIS — E559 Vitamin D deficiency, unspecified: Secondary | ICD-10-CM | POA: Diagnosis not present

## 2020-02-28 DIAGNOSIS — Z6841 Body Mass Index (BMI) 40.0 and over, adult: Secondary | ICD-10-CM | POA: Diagnosis not present

## 2020-02-28 DIAGNOSIS — I10 Essential (primary) hypertension: Secondary | ICD-10-CM | POA: Diagnosis not present

## 2020-02-28 MED ORDER — VITAMIN D (ERGOCALCIFEROL) 1.25 MG (50000 UNIT) PO CAPS: 50000.0000 [IU] | ORAL_CAPSULE | ORAL | 0 refills | Status: DC

## 2020-02-29 NOTE — Progress Notes (Signed)
Chief Complaint:   OBESITY Kelli Simmons is here to discuss her progress with her obesity treatment plan along with follow-up of her obesity related diagnoses. Kelli Simmons is on keeping a food journal and adhering to recommended goals of 1150-1250 calories and 80+ grams of protein daily and states she is following her eating plan approximately 80% of the time. Kelli Simmons states she is doing cardio, strengthening, and yoga for 90 minutes 2-4 times per week.  Today's visit was #: 5 Starting weight: 280 lbs Starting date: 12/20/2019 Today's weight: 277 lbs Today's date: 02/28/2020 Total lbs lost to date: 3 Total lbs lost since last in-office visit: 2  Interim History: Kelli Simmons has return to our clinic today since 02/03/2020. She took her last 0.50 mg injection of Wegovy 4 days ago, and had a glass of wine later that day and developed significant nausea. On Mondays and Tuesdays she is often missing breakfast and lunch and ending up around 900 calories. She thinks she is going to [place] in the next week to take care of her granddaughter.  Subjective:   1. Vitamin D deficiency Kelli Simmons denies nausea, vomiting, or muscle weakness, but notes fatigue. She is on prescription Vit D.  2. Essential hypertension Kelli Simmons's blood pressure is well controlled. She denies chest pain, chest pressure, or headache. She is on amlodipine 5 mg daily.  3. At risk for osteoporosis Kelli Simmons is at higher risk of osteopenia and osteoporosis due to Vitamin D deficiency.   Assessment/Plan:   1. Vitamin D deficiency Low Vitamin D level contributes to fatigue and are associated with obesity, breast, and colon cancer. We will refill prescription Vitamin D for 1 month. Kelli Simmons will follow-up for routine testing of Vitamin D, at least 2-3 times per year to avoid over-replacement.  - Vitamin D, Ergocalciferol, (DRISDOL) 1.25 MG (50000 UNIT) CAPS capsule; Take 1 capsule (50,000 Units total) by mouth every 7 (seven) days.  Dispense: 4  capsule; Refill: 0  2. Essential hypertension Kelli Simmons is working on healthy weight loss and exercise to improve blood pressure control. We will watch for signs of hypotension as she continues her lifestyle modifications. Kelli Simmons will continue her current medications, no change in dose.  3. At risk for osteoporosis Kelli Simmons was given approximately 15 minutes of osteoporosis prevention counseling today. Kelli Simmons is at risk for osteopenia and osteoporosis due to her Vitamin D deficiency. She was encouraged to take her Vitamin D and follow her higher calcium diet and increase strengthening exercise to help strengthen her bones and decrease her risk of osteopenia and osteoporosis.  Repetitive spaced learning was employed today to elicit superior memory formation and behavioral change.  4. Class 3 severe obesity with serious comorbidity and body mass index (BMI) of 50.0 to 59.9 in adult, unspecified obesity type (HCC) Kelli Simmons is currently in the action stage of change. As such, her goal is to continue with weight loss efforts. She has agreed to keeping a food journal and adhering to recommended goals of 1150-1250 calories and 80+ grams of  protein daily.   Exercise goals: As is.  Behavioral modification strategies: increasing lean protein intake, meal planning and cooking strategies, keeping healthy foods in the home and keeping a strict food journal.  Kelli Simmons has agreed to follow-up with our clinic in 3 weeks. She was informed of the importance of frequent follow-up visits to maximize her success with intensive lifestyle modifications for her multiple health conditions.   Objective:   Blood pressure 119/75, pulse 79, temperature 98.2 F (36.8  C), temperature source Oral, height 5\' 2"  (1.575 m), weight 277 lb (125.6 kg), SpO2 98 %. Body mass index is 50.66 kg/m.  General: Cooperative, alert, well developed, in no acute distress. HEENT: Conjunctivae and lids unremarkable. Cardiovascular: Regular rhythm.   Lungs: Normal work of breathing. Neurologic: No focal deficits.   Lab Results  Component Value Date   CREATININE 0.98 12/20/2019   BUN 11 12/20/2019   NA 140 12/20/2019   K 4.4 12/20/2019   CL 101 12/20/2019   CO2 26 12/20/2019   Lab Results  Component Value Date   ALT 19 12/20/2019   AST 24 12/20/2019   ALKPHOS 107 12/20/2019   BILITOT 0.4 12/20/2019   Lab Results  Component Value Date   HGBA1C 5.5 12/20/2019   Lab Results  Component Value Date   INSULIN 14.9 12/20/2019   No results found for: TSH Lab Results  Component Value Date   CHOL 199 12/20/2019   HDL 62 12/20/2019   LDLCALC 120 (H) 12/20/2019   TRIG 96 12/20/2019   Lab Results  Component Value Date   WBC 5.0 12/20/2019   HGB 14.3 12/20/2019   HCT 45.8 12/20/2019   MCV 96 12/20/2019   PLT 403 12/20/2019   No results found for: IRON, TIBC, FERRITIN  Attestation Statements:   Reviewed by clinician on day of visit: allergies, medications, problem list, medical history, surgical history, family history, social history, and previous encounter notes.   I, 12/22/2019, am acting as transcriptionist for Burt Knack, MD.  I have reviewed the above documentation for accuracy and completeness, and I agree with the above. - Reuben Likes, MD

## 2020-03-05 ENCOUNTER — Ambulatory Visit (INDEPENDENT_AMBULATORY_CARE_PROVIDER_SITE_OTHER): Payer: BC Managed Care – PPO | Admitting: Family Medicine

## 2020-03-25 ENCOUNTER — Encounter (INDEPENDENT_AMBULATORY_CARE_PROVIDER_SITE_OTHER): Payer: Self-pay

## 2020-03-25 ENCOUNTER — Encounter (INDEPENDENT_AMBULATORY_CARE_PROVIDER_SITE_OTHER): Payer: Self-pay | Admitting: Family Medicine

## 2020-03-25 NOTE — Telephone Encounter (Signed)
Please advise 

## 2020-03-26 ENCOUNTER — Other Ambulatory Visit (INDEPENDENT_AMBULATORY_CARE_PROVIDER_SITE_OTHER): Payer: Self-pay

## 2020-03-26 DIAGNOSIS — Z6841 Body Mass Index (BMI) 40.0 and over, adult: Secondary | ICD-10-CM

## 2020-03-26 MED ORDER — WEGOVY 1 MG/0.5ML ~~LOC~~ SOAJ: 1.0000 mg | SUBCUTANEOUS | 0 refills | Status: DC

## 2020-03-31 ENCOUNTER — Other Ambulatory Visit (INDEPENDENT_AMBULATORY_CARE_PROVIDER_SITE_OTHER): Payer: Self-pay | Admitting: Family Medicine

## 2020-03-31 DIAGNOSIS — E559 Vitamin D deficiency, unspecified: Secondary | ICD-10-CM

## 2020-04-03 ENCOUNTER — Ambulatory Visit (INDEPENDENT_AMBULATORY_CARE_PROVIDER_SITE_OTHER): Payer: BC Managed Care – PPO | Admitting: Family Medicine

## 2020-04-08 ENCOUNTER — Encounter (INDEPENDENT_AMBULATORY_CARE_PROVIDER_SITE_OTHER): Payer: Self-pay | Admitting: Adult Health

## 2020-04-08 ENCOUNTER — Ambulatory Visit (INDEPENDENT_AMBULATORY_CARE_PROVIDER_SITE_OTHER): Payer: BC Managed Care – PPO | Admitting: Adult Health

## 2020-04-08 ENCOUNTER — Other Ambulatory Visit: Payer: Self-pay

## 2020-04-08 VITALS — BP 139/77 | HR 82 | Temp 97.6°F | Ht 62.0 in | Wt 267.0 lb

## 2020-04-08 DIAGNOSIS — E559 Vitamin D deficiency, unspecified: Secondary | ICD-10-CM | POA: Diagnosis not present

## 2020-04-08 DIAGNOSIS — Z6841 Body Mass Index (BMI) 40.0 and over, adult: Secondary | ICD-10-CM | POA: Diagnosis not present

## 2020-04-08 DIAGNOSIS — Z9189 Other specified personal risk factors, not elsewhere classified: Secondary | ICD-10-CM | POA: Diagnosis not present

## 2020-04-08 DIAGNOSIS — I10 Essential (primary) hypertension: Secondary | ICD-10-CM

## 2020-04-08 MED ORDER — WEGOVY 1 MG/0.5ML ~~LOC~~ SOAJ: 2.0000 mg | SUBCUTANEOUS | 0 refills | Status: DC

## 2020-04-08 MED ORDER — VITAMIN D (ERGOCALCIFEROL) 1.25 MG (50000 UNIT) PO CAPS: 50000.0000 [IU] | ORAL_CAPSULE | ORAL | 0 refills | Status: DC

## 2020-04-09 DIAGNOSIS — I10 Essential (primary) hypertension: Secondary | ICD-10-CM | POA: Insufficient documentation

## 2020-04-09 DIAGNOSIS — E559 Vitamin D deficiency, unspecified: Secondary | ICD-10-CM | POA: Insufficient documentation

## 2020-04-09 NOTE — Progress Notes (Signed)
Chief Complaint:   OBESITY Kelli Simmons is here to discuss her progress with her obesity treatment plan along with follow-up of her obesity related diagnoses. Kelli Simmons is keeping a food journal and adhering to recommended goals of 1150-1250 calories and 80 grams of protein and states she is following her eating plan approximately 10% of the time. Kelli Simmons states she is doing You Tube exercises 45 minutes 2-3 times per week.  Today's visit was #: 6 Starting weight: 280 lbs Starting date: 12/20/2019 Today's weight: 267 lbs Today's date: 04/08/2020 Total lbs lost to date: 13 Total lbs lost since last in-office visit: 10  Interim History: Kelli Simmons was on the road for 3 weeks traveling for the Thanksgiving holiday. She mainly consumed salads, bunless burgers, and water. She is on Wegovy 1 mg once weekly - 5 weeks at this dosage. She denies mass in neck, dysphagia, dyspepsia, or persistent hoarseness.  Subjective:   Vitamin D deficiency. Vitamin D level on 12/20/2019 was 34.8, below goal of 50. [name] is on Ergocalciferol. No nausea, vomiting, or muscle weakness.     Ref. Range 12/20/2019 11:03  Vitamin D, 25-Hydroxy Latest Ref Range: 30.0 - 100.0 ng/mL 34.8   Essential hypertension. Blood pressure and heart rate are stable on today's office visit. [name] is on amlodipine 5 mg daily. She denies lower extremity edema.  BP Readings from Last 3 Encounters:  04/08/20 139/77  02/28/20 119/75  02/13/20 123/76   Lab Results  Component Value Date   CREATININE 0.98 12/20/2019   At risk for osteoporosis. Kelli Simmons is at higher risk of osteopenia and osteoporosis due to Vitamin D deficiency and obesity.   Assessment/Plan:   Vitamin D deficiency. Low Vitamin D level contributes to fatigue and are associated with obesity, breast, and colon cancer. She was given a refill on her Vitamin D, Ergocalciferol, (DRISDOL) 1.25 MG (50000 UNIT) CAPS capsule every week #4 with 0 refills and will follow-up for  routine testing of Vitamin D, at least 2-3 times per year to avoid over-replacement.   Essential hypertension. Kelli Simmons is working on healthy weight loss and exercise to improve blood pressure control. We will watch for signs of hypotension as she continues her lifestyle modifications. She will continue her calcium channel blocker as directed and will continue journaling.  At risk for osteoporosis. [name] was given approximately 15 minutes of osteoporosis prevention counseling today. Kelli Simmons is at risk for osteopenia and osteoporosis due to her Vitamin D deficiency. She was encouraged to take her Vitamin D and follow her higher calcium diet and increase strengthening exercise to help strengthen her bones and decrease her risk of osteopenia and osteoporosis.  Repetitive spaced learning was employed today to elicit superior memory formation and behavioral change.  Class 3 severe obesity with serious comorbidity and body mass index (BMI) of 50.0 to 59.9 in adult, unspecified obesity type (HCC). Refill was given for Semaglutide-Weight Management (WEGOVY) 1 MG/0.5ML SOAJ 1.7 mg once a week, dispense 3 mL with 0 refills.  Kelli Simmons is currently in the action stage of change. As such, her goal is to continue with weight loss efforts. She has agreed to keeping a food journal and adhering to recommended goals of 1150-1250 calories and 80 grams of  protein daily.   Exercise goals: Kelli Simmons will continue You Tube exercises 45 minutes 2-3 times per week.  Behavioral modification strategies: increasing lean protein intake, meal planning and cooking strategies and planning for success.  Kelli Simmons has agreed to follow-up with our clinic in  3 weeks. She was informed of the importance of frequent follow-up visits to maximize her success with intensive lifestyle modifications for her multiple health conditions.   Objective:   Blood pressure 139/77, pulse 82, temperature 97.6 F (36.4 C), height 5\' 2"  (1.575 m), weight 267  lb (121.1 kg), SpO2 99 %. Body mass index is 48.83 kg/m.  General: Cooperative, alert, well developed, in no acute distress. HEENT: Conjunctivae and lids unremarkable. Cardiovascular: Regular rhythm.  Lungs: Normal work of breathing. Neurologic: No focal deficits.   Lab Results  Component Value Date   CREATININE 0.98 12/20/2019   BUN 11 12/20/2019   NA 140 12/20/2019   K 4.4 12/20/2019   CL 101 12/20/2019   CO2 26 12/20/2019   Lab Results  Component Value Date   ALT 19 12/20/2019   AST 24 12/20/2019   ALKPHOS 107 12/20/2019   BILITOT 0.4 12/20/2019   Lab Results  Component Value Date   HGBA1C 5.5 12/20/2019   Lab Results  Component Value Date   INSULIN 14.9 12/20/2019   No results found for: TSH Lab Results  Component Value Date   CHOL 199 12/20/2019   HDL 62 12/20/2019   LDLCALC 120 (H) 12/20/2019   TRIG 96 12/20/2019   Lab Results  Component Value Date   WBC 5.0 12/20/2019   HGB 14.3 12/20/2019   HCT 45.8 12/20/2019   MCV 96 12/20/2019   PLT 403 12/20/2019   No results found for: IRON, TIBC, FERRITIN  Attestation Statements:   Reviewed by clinician on day of visit: allergies, medications, problem list, medical history, surgical history, family history, social history, and previous encounter notes.  I, 12/22/2019, am acting as Marianna [employer] for Energy manager, NP-C   I have reviewed the above documentation for accuracy and completeness, and I agree with the above. -  Erminia Mcnew d. Carle Dargan, NP-C

## 2020-04-14 ENCOUNTER — Encounter (INDEPENDENT_AMBULATORY_CARE_PROVIDER_SITE_OTHER): Payer: Self-pay | Admitting: Adult Health

## 2020-04-15 NOTE — Telephone Encounter (Signed)
Please advise 

## 2020-04-22 ENCOUNTER — Other Ambulatory Visit (INDEPENDENT_AMBULATORY_CARE_PROVIDER_SITE_OTHER): Payer: Self-pay | Admitting: Adult Health

## 2020-04-22 DIAGNOSIS — Z6841 Body Mass Index (BMI) 40.0 and over, adult: Secondary | ICD-10-CM

## 2020-04-30 ENCOUNTER — Ambulatory Visit (INDEPENDENT_AMBULATORY_CARE_PROVIDER_SITE_OTHER): Payer: BC Managed Care – PPO | Admitting: Adult Health

## 2020-05-01 ENCOUNTER — Other Ambulatory Visit (INDEPENDENT_AMBULATORY_CARE_PROVIDER_SITE_OTHER): Payer: Self-pay | Admitting: Adult Health

## 2020-05-01 DIAGNOSIS — E559 Vitamin D deficiency, unspecified: Secondary | ICD-10-CM

## 2020-05-04 ENCOUNTER — Other Ambulatory Visit (INDEPENDENT_AMBULATORY_CARE_PROVIDER_SITE_OTHER): Payer: Self-pay | Admitting: Family Medicine

## 2020-05-04 DIAGNOSIS — K219 Gastro-esophageal reflux disease without esophagitis: Secondary | ICD-10-CM

## 2020-05-06 NOTE — Telephone Encounter (Signed)
Mrs. Katy 

## 2020-05-07 NOTE — Telephone Encounter (Signed)
Refill request

## 2020-05-08 NOTE — Telephone Encounter (Signed)
Orpha Bur is OOO refill request.

## 2020-05-14 ENCOUNTER — Encounter (INDEPENDENT_AMBULATORY_CARE_PROVIDER_SITE_OTHER): Payer: Self-pay | Admitting: Adult Health

## 2020-05-14 ENCOUNTER — Other Ambulatory Visit: Payer: Self-pay

## 2020-05-14 ENCOUNTER — Telehealth (INDEPENDENT_AMBULATORY_CARE_PROVIDER_SITE_OTHER): Payer: BC Managed Care – PPO | Admitting: Adult Health

## 2020-05-14 DIAGNOSIS — Z6841 Body Mass Index (BMI) 40.0 and over, adult: Secondary | ICD-10-CM

## 2020-05-14 DIAGNOSIS — I10 Essential (primary) hypertension: Secondary | ICD-10-CM

## 2020-05-14 DIAGNOSIS — E559 Vitamin D deficiency, unspecified: Secondary | ICD-10-CM

## 2020-05-14 MED ORDER — VITAMIN D (ERGOCALCIFEROL) 1.25 MG (50000 UNIT) PO CAPS: 50000.0000 [IU] | ORAL_CAPSULE | ORAL | 0 refills | Status: DC

## 2020-05-14 MED ORDER — WEGOVY 1 MG/0.5ML ~~LOC~~ SOAJ: 2.0000 mg | SUBCUTANEOUS | 0 refills | Status: DC

## 2020-05-16 NOTE — Progress Notes (Addendum)
TeleHealth Visit:  Due to the COVID-19 pandemic, this visit was completed with telemedicine (audio/video) technology to reduce patient and provider exposure as well as to preserve personal protective equipment.   Kelli Simmons has verbally consented to this TeleHealth visit. The patient is located at home, the provider is located at the [facility] office. The participants in this visit include the listed provider and patient. The visit was conducted today via video.  Chief Complaint: OBESITY Kelli Simmons is here to discuss her progress with her obesity treatment plan along with follow-up of her obesity related diagnoses. Kelli Simmons is on 1150-1250 calories- 80 g protein and states she is following her eating plan approximately 70% of the time. Kelli Simmons states she is using [employer] 45 minutes 4 times per week.  Today's visit was #: 7 Starting weight: 280 lbs Starting date: 12/20/2019  Interim History:At last OV, Kelli Simmons was increased to Kelli Simmons 1.7 mg, however pharmacy did not fill due to supply shortage. She remained on Wegovy 1.0 mg once a week. She has been on this dose for 2 months. She denies mass in neck, dyspepsia, dysphagia, or persistent hoarseness.  Subjective:   1. Essential hypertension Kelli Simmons's ambulatory blood pressure today is 113/85. She is on amlodipine 5 mg daily.She denies lower extremity edema.  BP Readings from Last 3 Encounters:  04/08/20 139/77  02/28/20 119/75  02/13/20 123/76   2. Vitamin D deficiency Kelli Simmons's Vitamin D level was 34.8 on 12/20/2019. She is currently taking prescription vitamin D 50,000 IU each week. She denies nausea, vomiting or muscle weakness.   Ref. Range 12/20/2019 11:03  Vitamin D, 25-Hydroxy Latest Ref Range: 30.0 - 100.0 ng/mL 34.8   Assessment/Plan:   1. Essential hypertension Kelli Simmons is working on healthy weight loss and exercise to improve blood pressure control. We will watch for signs of hypotension as she continues her  lifestyle modifications. Continue calcium beta blocker and regular exercise. Needs fasting labs soon.  2. Vitamin D deficiency Low Vitamin D level contributes to fatigue and are associated with obesity, breast, and colon cancer. She agrees to continue to take prescription Vitamin D @50 ,000 IU every week and will follow-up for routine testing of Vitamin D, at least 2-3 times per year to avoid over-replacement. Needs fasting labs soon. Refill Vit D for 1 month, as per below. - Vitamin D, Ergocalciferol, (DRISDOL) 1.25 MG (50000 UNIT) CAPS capsule; Take 1 capsule (50,000 Units total) by mouth every 7 (seven) days.  Dispense: 4 capsule; Refill: 0  3. Class 3 severe obesity with serious comorbidity and body mass index (BMI) of 50.0 to 59.9 in adult, unspecified obesity type (HCC) Kelli Simmons is currently in the action stage of change. As such, her goal is to continue with weight loss efforts. She has agreed [phone] calories- 80 g protein  .   We discussed various medication options to help Kelli Simmons with her weight loss efforts and we both agreed to continue Wegovy 1.7 mg once a week.. - Semaglutide-Weight Management (WEGOVY) 1 MG/0.5ML SOAJ; Inject 2 mg into the skin once a week. Pt instructed to inject 1.7mg  into skin once a week.  Dispense: 3 mL; Refill: 0  Exercise goals: As is  Behavioral modification strategies: increasing lean protein intake, meal planning and cooking strategies, planning for success and keeping a strict food journal.  Kelli Simmons has agreed to follow-up with our clinic in 2 weeks. She was informed of the importance of frequent follow-up visits to maximize her success with intensive lifestyle modifications for  her multiple health conditions.  Objective:   VITALS: Per patient if applicable, see vitals. GENERAL: Alert and in no acute distress. CARDIOPULMONARY: No increased WOB. Speaking in clear sentences.  PSYCH: Pleasant and cooperative. Speech normal rate and rhythm. Affect is  appropriate. Insight and judgement are appropriate. Attention is focused, linear, and appropriate.  NEURO: Oriented as arrived to appointment on time with no prompting.   Lab Results  Component Value Date   CREATININE 0.98 12/20/2019   BUN 11 12/20/2019   NA 140 12/20/2019   K 4.4 12/20/2019   CL 101 12/20/2019   CO2 26 12/20/2019   Lab Results  Component Value Date   ALT 19 12/20/2019   AST 24 12/20/2019   ALKPHOS 107 12/20/2019   BILITOT 0.4 12/20/2019   Lab Results  Component Value Date   HGBA1C 5.5 12/20/2019   Lab Results  Component Value Date   INSULIN 14.9 12/20/2019   No results found for: TSH Lab Results  Component Value Date   CHOL 199 12/20/2019   HDL 62 12/20/2019   LDLCALC 120 (H) 12/20/2019   TRIG 96 12/20/2019   Lab Results  Component Value Date   WBC 5.0 12/20/2019   HGB 14.3 12/20/2019   HCT 45.8 12/20/2019   MCV 96 12/20/2019   PLT 403 12/20/2019   No results found for: IRON, TIBC, FERRITIN  Attestation Statements:   Reviewed by clinician on day of visit: allergies, medications, problem list, medical history, surgical history, family history, social history, and previous encounter notes.  Time spent on visit including pre-visit chart review and post-visit charting and care was 32 minutes.   Kelli Simmons, am acting as Energy manager for Kelli Hamburger, NP.  I have reviewed the above documentation for accuracy and completeness, and I agree with the above. - Kelli Simmons d. Kelli Canevari, NP-C

## 2020-05-27 ENCOUNTER — Telehealth (INDEPENDENT_AMBULATORY_CARE_PROVIDER_SITE_OTHER): Payer: Self-pay

## 2020-05-27 NOTE — Telephone Encounter (Signed)
An appeal for the Prior Authorization on Wegovy has been initiated via [email or web address]  Kelli Simmons ([employer]elan Pons) [id number] 1MG /0.5ML auto-injectors   Form Refer to Determination Appeal Form Appeal Created 10 days ago Sent to Plan less than a minute ago Determination Wait for Determination Please wait for the payer to return a determination.

## 2020-05-28 ENCOUNTER — Ambulatory Visit (INDEPENDENT_AMBULATORY_CARE_PROVIDER_SITE_OTHER): Payer: BC Managed Care – PPO | Admitting: Adult Health

## 2020-05-30 ENCOUNTER — Encounter (INDEPENDENT_AMBULATORY_CARE_PROVIDER_SITE_OTHER): Payer: Self-pay

## 2020-06-03 ENCOUNTER — Ambulatory Visit (INDEPENDENT_AMBULATORY_CARE_PROVIDER_SITE_OTHER): Payer: BC Managed Care – PPO | Admitting: Adult Health

## 2020-06-06 ENCOUNTER — Encounter (INDEPENDENT_AMBULATORY_CARE_PROVIDER_SITE_OTHER): Payer: Self-pay

## 2020-06-06 DIAGNOSIS — M549 Dorsalgia, unspecified: Secondary | ICD-10-CM | POA: Insufficient documentation

## 2020-06-06 DIAGNOSIS — R9389 Abnormal findings on diagnostic imaging of other specified body structures: Secondary | ICD-10-CM | POA: Insufficient documentation

## 2020-06-06 DIAGNOSIS — M5136 Other intervertebral disc degeneration, lumbar region: Secondary | ICD-10-CM | POA: Insufficient documentation

## 2020-06-06 DIAGNOSIS — M1990 Unspecified osteoarthritis, unspecified site: Secondary | ICD-10-CM | POA: Insufficient documentation

## 2020-06-06 DIAGNOSIS — F419 Anxiety disorder, unspecified: Secondary | ICD-10-CM | POA: Insufficient documentation

## 2020-06-12 ENCOUNTER — Ambulatory Visit (INDEPENDENT_AMBULATORY_CARE_PROVIDER_SITE_OTHER): Payer: BC Managed Care – PPO | Admitting: Adult Health

## 2020-06-12 ENCOUNTER — Encounter (INDEPENDENT_AMBULATORY_CARE_PROVIDER_SITE_OTHER): Payer: Self-pay | Admitting: Adult Health

## 2020-06-12 ENCOUNTER — Other Ambulatory Visit: Payer: Self-pay

## 2020-06-12 VITALS — BP 146/86 | HR 87 | Temp 98.3°F | Ht 62.0 in | Wt 271.0 lb

## 2020-06-12 DIAGNOSIS — I10 Essential (primary) hypertension: Secondary | ICD-10-CM | POA: Diagnosis not present

## 2020-06-12 DIAGNOSIS — Z9189 Other specified personal risk factors, not elsewhere classified: Secondary | ICD-10-CM

## 2020-06-12 DIAGNOSIS — Z6841 Body Mass Index (BMI) 40.0 and over, adult: Secondary | ICD-10-CM

## 2020-06-12 DIAGNOSIS — E8881 Metabolic syndrome: Secondary | ICD-10-CM

## 2020-06-12 DIAGNOSIS — E7800 Pure hypercholesterolemia, unspecified: Secondary | ICD-10-CM

## 2020-06-12 DIAGNOSIS — E559 Vitamin D deficiency, unspecified: Secondary | ICD-10-CM

## 2020-06-12 MED ORDER — SEMAGLUTIDE-WEIGHT MANAGEMENT 1 MG/0.5ML ~~LOC~~ SOAJ: 1.0000 mg | SUBCUTANEOUS | 0 refills | Status: DC

## 2020-06-13 ENCOUNTER — Encounter (INDEPENDENT_AMBULATORY_CARE_PROVIDER_SITE_OTHER): Payer: Self-pay | Admitting: Adult Health

## 2020-06-13 LAB — COMPREHENSIVE METABOLIC PANEL
ALT: 23 IU/L (ref 0–32)
AST: 30 IU/L (ref 0–40)
Albumin/Globulin Ratio: 1.3 (ref 1.2–2.2)
Albumin: 4.2 g/dL (ref 3.8–4.9)
Alkaline Phosphatase: 116 IU/L (ref 44–121)
BUN/Creatinine Ratio: 14 (ref 9–23)
BUN: 12 mg/dL (ref 6–24)
Bilirubin Total: 0.3 mg/dL (ref 0.0–1.2)
CO2: 23 mmol/L (ref 20–29)
Calcium: 9.4 mg/dL (ref 8.7–10.2)
Chloride: 104 mmol/L (ref 96–106)
Creatinine, Ser: 0.84 mg/dL (ref 0.57–1.00)
GFR calc Af Amer: 88 mL/min/{1.73_m2} (ref >59)
GFR calc non Af Amer: 76 mL/min/{1.73_m2} (ref >59)
Globulin, Total: 3.3 g/dL (ref 1.5–4.5)
Glucose: 89 mg/dL (ref 65–99)
Potassium: 4.6 mmol/L (ref 3.5–5.2)
Sodium: 141 mmol/L (ref 134–144)
Total Protein: 7.5 g/dL (ref 6.0–8.5)

## 2020-06-13 LAB — VITAMIN D 25 HYDROXY (VIT D DEFICIENCY, FRACTURES): Vit D, 25-Hydroxy: 37.8 ng/mL (ref 30.0–100.0)

## 2020-06-13 LAB — HEMOGLOBIN A1C
Est. average glucose Bld gHb Est-mCnc: 97 mg/dL
Hgb A1c MFr Bld: 5 % (ref 4.8–5.6)

## 2020-06-13 LAB — LIPID PANEL
Chol/HDL Ratio: 3.1 ratio (ref 0.0–4.4)
Cholesterol, Total: 204 mg/dL — ABNORMAL HIGH (ref 100–199)
HDL: 66 mg/dL (ref >39)
LDL Chol Calc (NIH): 122 mg/dL — ABNORMAL HIGH (ref 0–99)
Triglycerides: 88 mg/dL (ref 0–149)
VLDL Cholesterol Cal: 16 mg/dL (ref 5–40)

## 2020-06-13 LAB — INSULIN, RANDOM: INSULIN: 9.7 u[IU]/mL (ref 2.6–24.9)

## 2020-06-13 NOTE — Progress Notes (Signed)
Chief Complaint:   OBESITY Kelli Simmons is here to discuss her progress with her obesity treatment plan along with follow-up of her obesity related diagnoses. Kelli Simmons is on keeping a food journal and adhering to recommended goals of 1150-1250 calories and 80 g protein and states she is following her eating plan approximately 80% of the time. Kelli Simmons states she is going to the [employer] 45 minutes 1 times per week.  Today's visit was #: 8 Starting weight: 280 lbs Starting date: 12/20/2019 Today's weight: 271 lbs Today's date: 06/12/2020 Total lbs lost to date: 9 lbs Total lbs lost since last in-office visit: 0  Interim History: Pt is tracking intake 80% of the time and hitting goals 70% of the time. She has been on Wegovy since 01/03/2020. She titrated up to 1 mg. Her insurance will not cover Wegovy in 2022.  Subjective:   1. Essential hypertension SBP slightly elevated at 146/86. Pt has not taken amlodipine 5 mg daily prior to OV. She denies cardiac symptoms.  BP Readings from Last 3 Encounters:  06/12/20 (!) 146/86  04/08/20 139/77  02/28/20 119/75    2. Insulin resistance Pt has been on Wegovy since Sept 2021. She has titrated up to 1 mg once a week. She never increased to 1.7 mg. She has never been on Metformin.  3. Vitamin D deficiency Kelli Simmons's Vitamin D level was 34.8 on 12/20/2019. She is currently taking prescription vitamin D 50,000 IU each week. She denies nausea, vomiting or muscle weakness.   Ref. Range 12/20/2019 11:03  Vitamin D, 25-Hydroxy Latest Ref Range: 30.0 - 100.0 ng/mL 34.8   4. Pure hypercholesterolemia Pt's last lipid panel resulted an LDL above goal at 120 on 12/20/2019.   Ref. Range 12/20/2019 11:03  LDL Chol Calc (NIH) Latest Ref Range: 0 - 99 mg/dL 237 (H)   5. At risk for heart disease Kelli Simmons is at a higher than average risk for cardiovascular disease due to obesity, insulin resistance, hypertension, and hyperlipidemia  Assessment/Plan:   1. Essential  hypertension Kelli Simmons is working on healthy weight loss and exercise to improve blood pressure control. We will watch for signs of hypotension as she continues her lifestyle modifications. Check labs today.  - Comprehensive metabolic panel  2. Insulin resistance Kelli Simmons will continue to work on weight loss, exercise, and decreasing simple carbohydrates to help decrease the risk of diabetes. Kelli Simmons agreed to follow-up with [name] as directed to closely monitor her progress. Check labs today. Refill Wegovy 1 mg once a week- will try to have insurance cover GLP-1.  - Hemoglobin A1c - Insulin, random  3. Vitamin D deficiency Low Vitamin D level contributes to fatigue and are associated with obesity, breast, and colon cancer. She agrees to continue to take prescription Vitamin D @50 ,000 IU every week and will follow-up for routine testing of Vitamin D, at least 2-3 times per year to avoid over-replacement. Check labs today.  - VITAMIN D 25 Hydroxy (Vit-D Deficiency, Fractures)  4. Pure hypercholesterolemia Cardiovascular risk and specific lipid/LDL goals reviewed.  We discussed several lifestyle modifications today and Kelli Simmons will continue to work on diet, exercise and weight loss efforts. Orders and follow up as documented in patient record. Check labs today.  Counseling Intensive lifestyle modifications are the first line treatment for this issue. . Dietary changes: Increase soluble fiber. Decrease simple carbohydrates. . Exercise changes: Moderate to vigorous-intensity aerobic activity 150 minutes per week if tolerated. . Lipid-lowering medications: see documented in medical record. - Lipid panel  5. At risk for heart disease Kelli Simmons was given approximately 15 minutes of coronary artery disease prevention counseling today. She is 60 y.o. female and has risk factors for heart disease including obesity. We discussed intensive lifestyle modifications today with an emphasis on specific weight loss  instructions and strategies.   Repetitive spaced learning was employed today to elicit superior memory formation and behavioral change.  6. Class 3 severe obesity with serious comorbidity and body mass index (BMI) of 45.0 to 49.9 in adult, unspecified obesity type (HCC) Kelli Simmons is currently in the action stage of change. As such, her goal is to continue with weight loss efforts. She has agreed to keeping a food journal and adhering to recommended goals of 1150-1250 calories and 80 g protein.   Please call BC/BS and inquire which GLP-1 is covered. Continue Wegovy 1 mg once a week. Refill sent.  Exercise goals: No exercise has been prescribed at this time.  Behavioral modification strategies: increasing lean protein intake, meal planning and cooking strategies, planning for success and keeping a strict food journal.  Kelli Simmons has agreed to follow-up with our clinic in 3 weeks. She was informed of the importance of frequent follow-up visits to maximize her success with intensive lifestyle modifications for her multiple health conditions.   Kelli Simmons was informed we would discuss her lab results at her next visit unless there is a critical issue that needs to be addressed sooner. Kelli Simmons agreed to keep her next visit at the agreed upon time to discuss these results.  Objective:   Blood pressure (!) 146/86, pulse 87, temperature 98.3 F (36.8 C), height 5\' 2"  (1.575 m), weight 271 lb (122.9 kg), SpO2 100 %. Body mass index is 49.57 kg/m.  General: Cooperative, alert, well developed, in no acute distress. HEENT: Conjunctivae and lids unremarkable. Cardiovascular: Regular rhythm.  Lungs: Normal work of breathing. Neurologic: No focal deficits.   Lab Results  Component Value Date   CREATININE 0.84 06/12/2020   BUN 12 06/12/2020   NA 141 06/12/2020   K 4.6 06/12/2020   CL 104 06/12/2020   CO2 23 06/12/2020   Lab Results  Component Value Date   ALT 23 06/12/2020   AST 30 06/12/2020    ALKPHOS 116 06/12/2020   BILITOT 0.3 06/12/2020   Lab Results  Component Value Date   HGBA1C 5.0 06/12/2020   HGBA1C 5.5 12/20/2019   Lab Results  Component Value Date   INSULIN WILL FOLLOW 06/12/2020   INSULIN 14.9 12/20/2019   No results found for: TSH Lab Results  Component Value Date   CHOL 204 (H) 06/12/2020   HDL 66 06/12/2020   LDLCALC 122 (H) 06/12/2020   TRIG 88 06/12/2020   CHOLHDL 3.1 06/12/2020   Lab Results  Component Value Date   WBC 5.0 12/20/2019   HGB 14.3 12/20/2019   HCT 45.8 12/20/2019   MCV 96 12/20/2019   PLT 403 12/20/2019     Attestation Statements:   Reviewed by clinician on day of visit: allergies, medications, problem list, medical history, surgical history, family history, social history, and previous encounter notes.  12/22/2019, am acting as Edmund Hilda for Energy manager, NP.  I have reviewed the above documentation for accuracy and completeness, and I agree with the above. -  Margurite Duffy d. Etoy Mcdonnell, NP-C

## 2020-06-13 NOTE — Telephone Encounter (Signed)
FYI- were you going to send in [facility]?

## 2020-06-16 DIAGNOSIS — E7800 Pure hypercholesterolemia, unspecified: Secondary | ICD-10-CM | POA: Insufficient documentation

## 2020-06-16 DIAGNOSIS — E8881 Metabolic syndrome: Secondary | ICD-10-CM | POA: Insufficient documentation

## 2020-06-16 DIAGNOSIS — E88819 Insulin resistance, unspecified: Secondary | ICD-10-CM | POA: Insufficient documentation

## 2020-06-17 NOTE — Telephone Encounter (Signed)
Rx clarification

## 2020-06-18 ENCOUNTER — Encounter (INDEPENDENT_AMBULATORY_CARE_PROVIDER_SITE_OTHER): Payer: Self-pay

## 2020-07-01 ENCOUNTER — Other Ambulatory Visit: Payer: Self-pay

## 2020-07-01 ENCOUNTER — Ambulatory Visit (INDEPENDENT_AMBULATORY_CARE_PROVIDER_SITE_OTHER): Payer: BC Managed Care – PPO | Admitting: Adult Health

## 2020-07-01 ENCOUNTER — Encounter (INDEPENDENT_AMBULATORY_CARE_PROVIDER_SITE_OTHER): Payer: Self-pay | Admitting: Adult Health

## 2020-07-01 VITALS — BP 138/85 | HR 99 | Temp 97.7°F | Ht 62.0 in | Wt 280.0 lb

## 2020-07-01 DIAGNOSIS — Z6841 Body Mass Index (BMI) 40.0 and over, adult: Secondary | ICD-10-CM

## 2020-07-01 DIAGNOSIS — E8881 Metabolic syndrome: Secondary | ICD-10-CM

## 2020-07-01 DIAGNOSIS — E559 Vitamin D deficiency, unspecified: Secondary | ICD-10-CM

## 2020-07-01 DIAGNOSIS — E7849 Other hyperlipidemia: Secondary | ICD-10-CM | POA: Diagnosis not present

## 2020-07-01 DIAGNOSIS — Z9189 Other specified personal risk factors, not elsewhere classified: Secondary | ICD-10-CM

## 2020-07-01 DIAGNOSIS — E785 Hyperlipidemia, unspecified: Secondary | ICD-10-CM | POA: Insufficient documentation

## 2020-07-01 MED ORDER — VITAMIN D (ERGOCALCIFEROL) 1.25 MG (50000 UNIT) PO CAPS: 50000.0000 [IU] | ORAL_CAPSULE | ORAL | 0 refills | Status: DC

## 2020-07-01 MED ORDER — VITAMIN D (ERGOCALCIFEROL) 1.25 MG (50000 UNIT) PO CAPS: ORAL_CAPSULE | ORAL | 0 refills | Status: DC

## 2020-07-02 NOTE — Progress Notes (Signed)
Chief Complaint:   OBESITY Kelli Simmons is here to discuss her progress with her obesity treatment plan along with follow-up of her obesity related diagnoses. Kelli Simmons is on keeping a food journal and adhering to recommended goals of 1150-1250 calories and 80 g protein and states she is following her eating plan approximately 70% of the time. Kelli Simmons states she is doing cardio 45 minutes 2 times per week.  Today's visit was #: 9 Starting weight: 280 lbs Starting date: 12/20/2019 Today's weight: 280 lbs Today's date: 07/01/2020 Total lbs lost to date: 0 Total lbs lost since last in-office visit: 0  Interim History: In the last few weeks, Kelli Simmons's chronic back pain has increased and acute sinusitis symptoms developed. She was started on Cymbalta 30 mg daily for pain control and amoxicillin for 10 days for acute illness. She is on Wegovy 1 mg once a week and denies GI upset.  Subjective:   1. Vitamin D deficiency Discussed labs with patient today. Kelli Simmons's Vitamin D level was below goal of 50 at 37.8 on 06/12/2020. She has been on prescription vitamin D 50,000 IU each week since August 2021.    Ref. Range 06/12/2020 12:42  Vitamin D, 25-Hydroxy Latest Ref Range: 30.0 - 100.0 ng/mL 37.8   2. Insulin resistance 06/12/2020 insulin level is improving at 9.7, which is down from 14.9 on 12/20/2019. She has been on Wegovy 1 mg once a week and tolerating it well.  Lab Results  Component Value Date   INSULIN 9.7 06/12/2020   INSULIN 14.9 12/20/2019   Lab Results  Component Value Date   HGBA1C 5.0 06/12/2020    3. Other hyperlipidemia 06/12/2020 Lipid Panel resulted total and LDL both increased and now both above respective goals. She is not on statin therapy.  Lab Results  Component Value Date   ALT 23 06/12/2020   AST 30 06/12/2020   ALKPHOS 116 06/12/2020   BILITOT 0.3 06/12/2020   Lab Results  Component Value Date   CHOL 204 (H) 06/12/2020   HDL 66 06/12/2020   LDLCALC 122 (H)  06/12/2020   TRIG 88 06/12/2020   CHOLHDL 3.1 06/12/2020    4. At risk for heart disease Kelli Simmons is at a higher than average risk for cardiovascular disease due to obesity and insulin resistance.  Assessment/Plan:   1. Vitamin D deficiency Low Vitamin D level contributes to fatigue and are associated with obesity, breast, and colon cancer. She agrees to continue to take prescription Vitamin D @50 ,000 IU twice a week and the next Vit D refill will be back down to once a week. She will follow-up for routine testing of Vitamin D, at least 2-3 times per year to avoid over-replacement.  - Vitamin D, Ergocalciferol, (DRISDOL) 1.25 MG (50000 UNIT) CAPS capsule; Take one capsule by mouth twice per week.  Dispense: 8 capsule; Refill: 0  2. Insulin resistance Kelli Simmons will continue to work on weight loss, exercise, and decreasing simple carbohydrates to help decrease the risk of diabetes. Kelli Simmons agreed to follow-up with Bjorn Loser as directed to closely monitor her progress. Continue Wegovy 1 mg once a week and journaling.  3. Other hyperlipidemia Cardiovascular risk and specific lipid/LDL goals reviewed.  We discussed several lifestyle modifications today and Kelli Simmons will continue to work on diet, exercise and weight loss efforts. Orders and follow up as documented in patient record.   Counseling Intensive lifestyle modifications are the first line treatment for this issue. . Dietary changes: Increase soluble fiber. Decrease simple carbohydrates. Bjorn Loser  Exercise changes: Moderate to vigorous-intensity aerobic activity 150 minutes per week if tolerated. . Lipid-lowering medications: see documented in medical record.  4. At risk for heart disease Kelli Simmons was given approximately 15 minutes of coronary artery disease prevention counseling today. She is 60 y.o. female and has risk factors for heart disease including obesity. We discussed intensive lifestyle modifications today with an emphasis on specific weight loss  instructions and strategies.   Repetitive spaced learning was employed today to elicit superior memory formation and behavioral change.  5. Class 3 severe obesity with serious comorbidity and body mass index (BMI) of 50.0 to 59.9 in adult, unspecified obesity type (HCC) Kelli Simmons is currently in the action stage of change. As such, her goal is to continue with weight loss efforts. She has agreed to keeping a food journal and adhering to recommended goals of 1150-1250 calories and 80 g protein.   Exercise goals: As is  Behavioral modification strategies: increasing lean protein intake, decreasing simple carbohydrates, no skipping meals, meal planning and cooking strategies, planning for success and keeping a strict food journal.  Kelli Simmons has agreed to follow-up with our clinic in 3 weeks. She was informed of the importance of frequent follow-up visits to maximize her success with intensive lifestyle modifications for her multiple health conditions.   Objective:   Blood pressure 138/85, pulse 99, temperature 97.7 F (36.5 C), height 5\' 2"  (1.575 m), weight 280 lb (127 kg), SpO2 100 %. Body mass index is 51.21 kg/m.  General: Cooperative, alert, well developed, in no acute distress. HEENT: Conjunctivae and lids unremarkable. Cardiovascular: Regular rhythm.  Lungs: Normal work of breathing. Neurologic: No focal deficits.   Lab Results  Component Value Date   CREATININE 0.84 06/12/2020   BUN 12 06/12/2020   NA 141 06/12/2020   K 4.6 06/12/2020   CL 104 06/12/2020   CO2 23 06/12/2020   Lab Results  Component Value Date   ALT 23 06/12/2020   AST 30 06/12/2020   ALKPHOS 116 06/12/2020   BILITOT 0.3 06/12/2020   Lab Results  Component Value Date   HGBA1C 5.0 06/12/2020   HGBA1C 5.5 12/20/2019   Lab Results  Component Value Date   INSULIN 9.7 06/12/2020   INSULIN 14.9 12/20/2019   No results found for: TSH Lab Results  Component Value Date   CHOL 204 (H) 06/12/2020   HDL 66  06/12/2020   LDLCALC 122 (H) 06/12/2020   TRIG 88 06/12/2020   CHOLHDL 3.1 06/12/2020   Lab Results  Component Value Date   WBC 5.0 12/20/2019   HGB 14.3 12/20/2019   HCT 45.8 12/20/2019   MCV 96 12/20/2019   PLT 403 12/20/2019    Attestation Statements:   Reviewed by clinician on day of visit: allergies, medications, problem list, medical history, surgical history, family history, social history, and previous encounter notes.  12/22/2019, am acting as Edmund Hilda for Energy manager, NP.  I have reviewed the above documentation for accuracy and completeness, and I agree with the above. -  Azelyn Batie d. Kelli Sparger, NP-C

## 2020-07-22 ENCOUNTER — Ambulatory Visit (INDEPENDENT_AMBULATORY_CARE_PROVIDER_SITE_OTHER): Payer: BC Managed Care – PPO | Admitting: Adult Health

## 2020-07-22 ENCOUNTER — Other Ambulatory Visit: Payer: Self-pay

## 2020-07-22 VITALS — BP 114/76 | HR 73 | Temp 97.8°F | Ht 62.0 in | Wt 288.0 lb

## 2020-07-22 DIAGNOSIS — Z9189 Other specified personal risk factors, not elsewhere classified: Secondary | ICD-10-CM

## 2020-07-22 DIAGNOSIS — E8881 Metabolic syndrome: Secondary | ICD-10-CM

## 2020-07-22 DIAGNOSIS — Z6841 Body Mass Index (BMI) 40.0 and over, adult: Secondary | ICD-10-CM

## 2020-07-22 DIAGNOSIS — E559 Vitamin D deficiency, unspecified: Secondary | ICD-10-CM | POA: Diagnosis not present

## 2020-07-22 MED ORDER — SEMAGLUTIDE-WEIGHT MANAGEMENT 1 MG/0.5ML ~~LOC~~ SOAJ
2.0000 mg | SUBCUTANEOUS | 0 refills | Status: DC
Start: 2020-07-22 — End: 2020-08-29

## 2020-07-23 NOTE — Progress Notes (Signed)
Chief Complaint:   OBESITY Kelli Simmons is here to discuss her progress with her obesity treatment plan along with follow-up of her obesity related diagnoses. Kelli Simmons is on keeping a food journal and adhering to recommended goals of 1150-1250 calories and 80 g protein and states she is following her eating plan approximately 0% of the time. Kelli Simmons states she is doing 0 minutes 0 times per week.  Today's visit was #: 10 Starting weight: 280 lbs Starting date: 12/20/2019 Today's weight: 288 lbs Today's date: 07/22/2020 Total lbs lost to date: 0 Total lbs lost since last in-office visit: 0  Interim History: Kelli Simmons traveled to 8 cities in the last 2 weeks- from [place], [place], to [place]. Her husband is a Research scientist (medical) for trucking safety and she will accompany him on work trips. It will be quite a challenge to eat on the journaling plan during sustained travel.  She is on Wegovy 1 mg once a week. She is experiencing polyphagia. She denies nausea or constipation.  Pt denies mass in neck, dysphagia, dyspepsia, or persistent hoarseness.  Subjective:   1. Insulin resistance Dan's 06/12/2020 insulin level was 9.7 with normal blood glucose of 89 and A1c of 5.0. She has been on injectable GLP-1 for obesity for more than 16 weeks and tolerating it well. She reports increased polyphagia despite therapy.  Lab Results  Component Value Date   INSULIN 9.7 06/12/2020   INSULIN 14.9 12/20/2019   Lab Results  Component Value Date   HGBA1C 5.0 06/12/2020    2. Vitamin D deficiency Kelli Simmons's Vitamin D level was below goal of 50 at 37.8 on 06/12/2020. She is currently taking prescription vitamin D 50,000 IU each week. She denies nausea, vomiting or muscle weakness.  3. At risk for constipation Kelli Simmons is at risk for constipation due to increasing GLP-1 dosage due to polyphagia.  Assessment/Plan:   1. Insulin resistance Kelli Simmons will continue to work on weight loss, exercise, and decreasing simple carbohydrates to  help decrease the risk of diabetes. Kelli Simmons agreed to follow-up with [name] as directed to closely monitor her progress. Increase protein and regular exercise  2. Vitamin D deficiency Low Vitamin D level contributes to fatigue and are associated with obesity, breast, and colon cancer. She agrees to continue to take prescription Vitamin D @50 ,000 IU every week and will follow-up for routine testing of Vitamin D, at least 2-3 times per year to avoid over-replacement. No refill needed today.  3. At risk for constipation Kelli Simmons was given approximately 15 minutes of counseling today regarding prevention of constipation. She was encouraged to increase water and fiber intake.   4. Class 3 severe obesity with serious comorbidity and body mass index (BMI) of 50.0 to 59.9 in adult, unspecified obesity type (HCC) Kelli Simmons is currently in the action stage of change. As such, her goal is to continue with weight loss efforts. She has agreed to keeping a food journal and adhering to recommended goals of 1200 calories and 80 g protein.   We discussed various medication options to help Kelli Simmons with her weight loss efforts and we both agreed to increase Wegovy, as per below. - Semaglutide-Weight Management 1 MG/0.5ML SOAJ; Inject 2 mg into the skin once a week.  Dispense: 4 mL; Refill: 0  Exercise goals: As is- increase gym time and daily walking  Behavioral modification strategies: increasing lean protein intake, decreasing simple carbohydrates, decreasing eating out, no skipping meals, meal planning and cooking strategies, better snacking choices, planning for success and keeping  a strict food journal.  Kelli Simmons has agreed to follow-up with our clinic in 3 weeks. She was informed of the importance of frequent follow-up visits to maximize her success with intensive lifestyle modifications for her multiple health conditions.   Objective:   Blood pressure 114/76, pulse 73, temperature 97.8 F (36.6 C), height 5\' 2"  (1.575  m), weight 288 lb (130.6 kg), SpO2 100 %. Body mass index is 52.68 kg/m.  General: Cooperative, alert, well developed, in no acute distress. HEENT: Conjunctivae and lids unremarkable. Cardiovascular: Regular rhythm.  Lungs: Normal work of breathing. Neurologic: No focal deficits.   Lab Results  Component Value Date   CREATININE 0.84 06/12/2020   BUN 12 06/12/2020   NA 141 06/12/2020   K 4.6 06/12/2020   CL 104 06/12/2020   CO2 23 06/12/2020   Lab Results  Component Value Date   ALT 23 06/12/2020   AST 30 06/12/2020   ALKPHOS 116 06/12/2020   BILITOT 0.3 06/12/2020   Lab Results  Component Value Date   HGBA1C 5.0 06/12/2020   HGBA1C 5.5 12/20/2019   Lab Results  Component Value Date   INSULIN 9.7 06/12/2020   INSULIN 14.9 12/20/2019   No results found for: TSH Lab Results  Component Value Date   CHOL 204 (H) 06/12/2020   HDL 66 06/12/2020   LDLCALC 122 (H) 06/12/2020   TRIG 88 06/12/2020   CHOLHDL 3.1 06/12/2020   Lab Results  Component Value Date   WBC 5.0 12/20/2019   HGB 14.3 12/20/2019   HCT 45.8 12/20/2019   MCV 96 12/20/2019   PLT 403 12/20/2019    Attestation Statements:   Reviewed by clinician on day of visit: allergies, medications, problem list, medical history, surgical history, family history, social history, and previous encounter notes.  12/22/2019, am acting as Edmund Hilda for Energy manager, NP.  I have reviewed the above documentation for accuracy and completeness, and I agree with the above. -  Gabrielle Wakeland d. Taylor Spilde, NP-C

## 2020-07-26 ENCOUNTER — Encounter (INDEPENDENT_AMBULATORY_CARE_PROVIDER_SITE_OTHER): Payer: Self-pay | Admitting: Adult Health

## 2020-07-29 ENCOUNTER — Other Ambulatory Visit (INDEPENDENT_AMBULATORY_CARE_PROVIDER_SITE_OTHER): Payer: Self-pay | Admitting: Adult Health

## 2020-07-29 DIAGNOSIS — E559 Vitamin D deficiency, unspecified: Secondary | ICD-10-CM

## 2020-07-29 NOTE — Telephone Encounter (Signed)
Last seen by Katy Danford, FNP. 

## 2020-07-30 NOTE — Telephone Encounter (Signed)
FYI

## 2020-08-08 ENCOUNTER — Ambulatory Visit (INDEPENDENT_AMBULATORY_CARE_PROVIDER_SITE_OTHER): Payer: BC Managed Care – PPO | Admitting: Adult Health

## 2020-08-19 ENCOUNTER — Other Ambulatory Visit (INDEPENDENT_AMBULATORY_CARE_PROVIDER_SITE_OTHER): Payer: Self-pay | Admitting: Adult Health

## 2020-08-19 DIAGNOSIS — E559 Vitamin D deficiency, unspecified: Secondary | ICD-10-CM

## 2020-08-20 MED ORDER — VITAMIN D (ERGOCALCIFEROL) 1.25 MG (50000 UNIT) PO CAPS: ORAL_CAPSULE | ORAL | 0 refills | Status: DC

## 2020-08-20 NOTE — Telephone Encounter (Signed)
Refill request

## 2020-08-29 ENCOUNTER — Other Ambulatory Visit: Payer: Self-pay

## 2020-08-29 ENCOUNTER — Encounter (INDEPENDENT_AMBULATORY_CARE_PROVIDER_SITE_OTHER): Payer: Self-pay | Admitting: Adult Health

## 2020-08-29 ENCOUNTER — Ambulatory Visit (INDEPENDENT_AMBULATORY_CARE_PROVIDER_SITE_OTHER): Payer: BC Managed Care – PPO | Admitting: Adult Health

## 2020-08-29 VITALS — BP 131/85 | HR 91 | Temp 98.0°F | Ht 62.0 in | Wt 289.0 lb

## 2020-08-29 DIAGNOSIS — E559 Vitamin D deficiency, unspecified: Secondary | ICD-10-CM | POA: Diagnosis not present

## 2020-08-29 DIAGNOSIS — Z9189 Other specified personal risk factors, not elsewhere classified: Secondary | ICD-10-CM

## 2020-08-29 DIAGNOSIS — Z6841 Body Mass Index (BMI) 40.0 and over, adult: Secondary | ICD-10-CM | POA: Diagnosis not present

## 2020-08-29 DIAGNOSIS — E8881 Metabolic syndrome: Secondary | ICD-10-CM

## 2020-08-29 MED ORDER — SEMAGLUTIDE-WEIGHT MANAGEMENT 1.7 MG/0.75ML ~~LOC~~ SOAJ: 1.7000 mg | SUBCUTANEOUS | 0 refills | Status: DC

## 2020-08-30 ENCOUNTER — Other Ambulatory Visit (INDEPENDENT_AMBULATORY_CARE_PROVIDER_SITE_OTHER): Payer: Self-pay | Admitting: Adult Health

## 2020-08-30 ENCOUNTER — Encounter (INDEPENDENT_AMBULATORY_CARE_PROVIDER_SITE_OTHER): Payer: Self-pay | Admitting: Adult Health

## 2020-08-30 LAB — VITAMIN D 25 HYDROXY (VIT D DEFICIENCY, FRACTURES): Vit D, 25-Hydroxy: 50.9 ng/mL (ref 30.0–100.0)

## 2020-09-02 NOTE — Telephone Encounter (Signed)
Pt response.

## 2020-09-02 NOTE — Progress Notes (Signed)
Chief Complaint:   OBESITY Kelli Simmons is here to discuss her progress with her obesity treatment plan along with follow-up of her obesity related diagnoses. Kelli Simmons is on keeping a food journal and adhering to recommended goals of 1200 calories and 80 g protein and states she is following her eating plan approximately 40% of the time. Kelli Simmons states she is doing cardio and weight training 60 minutes 2 times per week.  Today's visit was #: 11 Starting weight: 280 lbs Starting date: 12/20/2019 Today's weight: 289 lbs Today's date: 08/29/2020 Total lbs lost to date: 0 Total lbs lost since last in-office visit: 0  Interim History: Kelli Simmons has traveled with her husband again- for work to [place], [facility]- 12 total days over the 3 weeks. She estimates to track intake and hit goals both about 40% of the time. She has remained on Semaglutide 1 mg once per week and been on this dose for >20 weeks.  Subjective:   1. Insulin resistance Kelli Simmons has been on Semaglutide 1 mg once a week for 20 weeks and is tolerating it well. She feels that hunger levels have increased the last few weeks.  Lab Results  Component Value Date   INSULIN 9.7 06/12/2020   INSULIN 14.9 12/20/2019   Lab Results  Component Value Date   HGBA1C 5.0 06/12/2020   2. Vitamin D deficiency Kelli Simmons's Vitamin D level was 37.8 on 06/12/2020. She is currently taking prescription vitamin D 50,000 IU twice a week. She denies nausea, vomiting or muscle weakness.  3. At risk for constipation Kelli Simmons is at risk for constipation due to increase of GLP-1 for insulin resistance and obesity.  Assessment/Plan:   1. Insulin resistance Kelli Simmons will continue to work on weight loss, exercise, and decreasing simple carbohydrates to help decrease the risk of diabetes. Kelli Simmons agreed to follow-up with [name] as directed to closely monitor her progress. Increase Semaglutide to 1.7 mg, as prescribed below. Do not overeat on GLP-1.  - Semaglutide-Weight Management 1.7  MG/0.75ML SOAJ; Inject 1.7 mg into the skin once a week.  Dispense: 3 mL; Refill: 0  2. Vitamin D deficiency Low Vitamin D level contributes to fatigue and are associated with obesity, breast, and colon cancer. She agrees to continue to take prescription Vitamin D @50 ,000 IU, pending lab results and will follow-up for routine testing of Vitamin D, at least 2-3 times per year to avoid over-replacement. Check labs today.  - VITAMIN D 25 Hydroxy (Vit-D Deficiency, Fractures)  3. At risk for constipation Kelli Simmons was given approximately 15 minutes of counseling today regarding prevention of constipation. She was encouraged to increase water and fiber intake.   4. Class 3 severe obesity with serious comorbidity and body mass index (BMI) of 50.0 to 59.9 in adult, unspecified obesity type (HCC)  Kelli Simmons is currently in the action stage of change. As such, her goal is to continue with weight loss efforts. She has agreed to keeping a food journal and adhering to recommended goals of 1200 calories and 80 g protein.   Exercise goals: As is  Behavioral modification strategies: increasing lean protein intake, decreasing simple carbohydrates, meal planning and cooking strategies, better snacking choices and planning for success.  Kelli Simmons has agreed to follow-up with our clinic in 4 weeks. She was informed of the importance of frequent follow-up visits to maximize her success with intensive lifestyle modifications for her multiple health conditions.   Kelli Simmons was informed we would discuss her lab results at her next visit unless there  is a critical issue that needs to be addressed sooner. Kelli Simmons agreed to keep her next visit at the agreed upon time to discuss these results.  Objective:   Blood pressure 131/85, pulse 91, temperature 98 F (36.7 C), height 5\' 2"  (1.575 m), weight 289 lb (131.1 kg), SpO2 98 %. Body mass index is 52.86 kg/m.  General: Cooperative, alert, well developed, in no acute  distress. HEENT: Conjunctivae and lids unremarkable. Cardiovascular: Regular rhythm.  Lungs: Normal work of breathing. Neurologic: No focal deficits.   Lab Results  Component Value Date   CREATININE 0.84 06/12/2020   BUN 12 06/12/2020   NA 141 06/12/2020   K 4.6 06/12/2020   CL 104 06/12/2020   CO2 23 06/12/2020   Lab Results  Component Value Date   ALT 23 06/12/2020   AST 30 06/12/2020   ALKPHOS 116 06/12/2020   BILITOT 0.3 06/12/2020   Lab Results  Component Value Date   HGBA1C 5.0 06/12/2020   HGBA1C 5.5 12/20/2019   Lab Results  Component Value Date   INSULIN 9.7 06/12/2020   INSULIN 14.9 12/20/2019   No results found for: TSH Lab Results  Component Value Date   CHOL 204 (H) 06/12/2020   HDL 66 06/12/2020   LDLCALC 122 (H) 06/12/2020   TRIG 88 06/12/2020   CHOLHDL 3.1 06/12/2020   Lab Results  Component Value Date   WBC 5.0 12/20/2019   HGB 14.3 12/20/2019   HCT 45.8 12/20/2019   MCV 96 12/20/2019   PLT 403 12/20/2019    Attestation Statements:   Reviewed by clinician on day of visit: allergies, medications, problem list, medical history, surgical history, family history, social history, and previous encounter notes.  12/22/2019, CMA, am acting as transcriptionist for Edmund Hilda, NP.  I have reviewed the above documentation for accuracy and completeness, and I agree with the above. -  Izadora Roehr d. Jaree Trinka, NP-C

## 2020-09-09 ENCOUNTER — Other Ambulatory Visit (INDEPENDENT_AMBULATORY_CARE_PROVIDER_SITE_OTHER): Payer: Self-pay | Admitting: Adult Health

## 2020-09-09 DIAGNOSIS — E559 Vitamin D deficiency, unspecified: Secondary | ICD-10-CM

## 2020-10-01 ENCOUNTER — Ambulatory Visit (INDEPENDENT_AMBULATORY_CARE_PROVIDER_SITE_OTHER): Payer: BC Managed Care – PPO | Admitting: Adult Health

## 2020-10-02 ENCOUNTER — Other Ambulatory Visit: Payer: Self-pay

## 2020-10-02 ENCOUNTER — Ambulatory Visit (INDEPENDENT_AMBULATORY_CARE_PROVIDER_SITE_OTHER): Payer: BC Managed Care – PPO | Admitting: Adult Health

## 2020-10-02 VITALS — BP 126/83 | HR 89 | Temp 98.3°F | Ht 62.0 in | Wt 288.0 lb

## 2020-10-02 DIAGNOSIS — M25561 Pain in right knee: Secondary | ICD-10-CM | POA: Diagnosis not present

## 2020-10-02 DIAGNOSIS — Z9189 Other specified personal risk factors, not elsewhere classified: Secondary | ICD-10-CM | POA: Diagnosis not present

## 2020-10-02 DIAGNOSIS — M25562 Pain in left knee: Secondary | ICD-10-CM | POA: Diagnosis not present

## 2020-10-02 DIAGNOSIS — G8929 Other chronic pain: Secondary | ICD-10-CM | POA: Insufficient documentation

## 2020-10-02 DIAGNOSIS — E8881 Metabolic syndrome: Secondary | ICD-10-CM | POA: Diagnosis not present

## 2020-10-02 DIAGNOSIS — Z6841 Body Mass Index (BMI) 40.0 and over, adult: Secondary | ICD-10-CM

## 2020-10-02 MED ORDER — SEMAGLUTIDE-WEIGHT MANAGEMENT 1.7 MG/0.75ML ~~LOC~~ SOAJ: 1.7000 mg | SUBCUTANEOUS | 0 refills | Status: DC

## 2020-10-08 NOTE — Progress Notes (Signed)
Chief Complaint:   OBESITY Kelli Simmons is here to discuss her progress with her obesity treatment plan along with follow-up of her obesity related diagnoses. Kelli Simmons is on keeping a food journal and adhering to recommended goals of 1200 calories and 80 grams protein and states she is following her eating plan approximately 50% of the time. Kelli Simmons states she is not currently exercising.   Today's visit was #: 12 Starting weight: 280 lbs Starting date: 12/20/2019 Today's weight: 288 lbs Today's date: 10/02/2020 Total lbs lost to date: 0 Total lbs lost since last in-office visit: 1  Interim History: Semaglutide was increased from 1 to 1.7 mg once a week at last OV on 08/29/2020.  Kelli Simmons has been in [place] for 2.5 weeks and tried to consume more chicken, salads, and water.  She is down 1 lb since her last OV. Bilateral chronic knee pain is worsening. She needs bilateral TKR.  Subjective:   1. Insulin resistance Semaglutide was increased from 1 to 1.7 mg once a week at last OV on 08/29/2020.  Esli tolerated increase of Semaglutide and reports a decline in polyphagia.  2. Chronic pain of both knees Kelli Simmons's current BMI 52.7. She reports increased bilateral knee pain and worsening immobility.  PCP increased Cymbalta from 30 mg QD to 60 mg QD.  3. At risk for activity intolerance Kelli Simmons is at risk for exercise intolerance due to chronic bilateral knee pain.   Assessment/Plan:   1. Insulin resistance Kelli Simmons will continue to work on weight loss, exercise, and decreasing simple carbohydrates to help decrease the risk of diabetes. Kelli Simmons agreed to follow-up with [name] as directed to closely monitor her progress.  - Semaglutide-Weight Management 1.7 MG/0.75ML SOAJ; Inject 1.7 mg into the skin once a week.  Dispense: 3 mL; Refill: 0  2. Chronic pain of both knees Referral placed to [facility] for possible bariatric surgery Fall 2022 (after her husband's insurance will change, then cover the  surgery).  3. At risk for activity intolerance Kelli Simmons was given approximately 15 minutes of exercise intolerance counseling today. She is 60 y.o. female and has risk factors exercise intolerance including obesity. We discussed intensive lifestyle modifications today with an emphasis on specific weight loss instructions and strategies. Kelli Simmons will slowly increase activity as tolerated.  Repetitive spaced learning was employed today to elicit superior memory formation and behavioral change.   4. Class 3 severe obesity with serious comorbidity and body mass index (BMI) of 50.0 to 59.9 in adult, unspecified obesity type (HCC)  Kelli Simmons is currently in the action stage of change. As such, her goal is to continue with weight loss efforts. She has agreed to keeping a food journal and adhering to recommended goals of 1200 calories and 80 grams protein.   - Amb Referral to Bariatric Surgery  Exercise goals:  Helping son more.  Behavioral modification strategies: increasing lean protein intake, decreasing simple carbohydrates, meal planning and cooking strategies, keeping healthy foods in the home, and planning for success.  Kelli Simmons has agreed to follow-up with our clinic in 3 weeks. She was informed of the importance of frequent follow-up visits to maximize her success with intensive lifestyle modifications for her multiple health conditions.   Objective:   Blood pressure 126/83, pulse 89, temperature 98.3 F (36.8 C), height 5\' 2"  (1.575 m), weight 288 lb (130.6 kg), SpO2 98 %. Body mass index is 52.68 kg/m.  General: Cooperative, alert, well developed, in no acute distress. HEENT: Conjunctivae and lids unremarkable. Cardiovascular: Regular rhythm.  Lungs: Normal work of breathing. Neurologic: No focal deficits.   Lab Results  Component Value Date   CREATININE 0.84 06/12/2020   BUN 12 06/12/2020   NA 141 06/12/2020   K 4.6 06/12/2020   CL 104 06/12/2020   CO2 23 06/12/2020   Lab Results   Component Value Date   ALT 23 06/12/2020   AST 30 06/12/2020   ALKPHOS 116 06/12/2020   BILITOT 0.3 06/12/2020   Lab Results  Component Value Date   HGBA1C 5.0 06/12/2020   HGBA1C 5.5 12/20/2019   Lab Results  Component Value Date   INSULIN 9.7 06/12/2020   INSULIN 14.9 12/20/2019   No results found for: TSH Lab Results  Component Value Date   CHOL 204 (H) 06/12/2020   HDL 66 06/12/2020   LDLCALC 122 (H) 06/12/2020   TRIG 88 06/12/2020   CHOLHDL 3.1 06/12/2020   Lab Results  Component Value Date   WBC 5.0 12/20/2019   HGB 14.3 12/20/2019   HCT 45.8 12/20/2019   MCV 96 12/20/2019   PLT 403 12/20/2019   No results found for: IRON, TIBC, FERRITIN   Attestation Statements:   Reviewed by clinician on day of visit: allergies, medications, problem list, medical history, surgical history, family history, social history, and previous encounter notes.  Edmund Hilda, CMA, am acting as transcriptionist for William Hamburger, NP.  I have reviewed the above documentation for accuracy and completeness, and I agree with the above. -  Roshelle Traub d. Jamila Slatten, NP-C

## 2020-10-11 ENCOUNTER — Other Ambulatory Visit: Payer: Self-pay | Admitting: Obstetrics & Gynecology

## 2020-10-23 ENCOUNTER — Ambulatory Visit (INDEPENDENT_AMBULATORY_CARE_PROVIDER_SITE_OTHER): Payer: BC Managed Care – PPO | Admitting: Adult Health

## 2020-10-23 NOTE — Progress Notes (Signed)
Surgical Instructions    Your procedure is scheduled on 11/01/20.  Report to [facility] Main Entrance "A" at 11:30 A.M., then check in with the Admitting office.  Call this number if you have problems the morning of surgery:  [phone]   If you have any questions prior to your surgery date call [phone]: Open Monday-Friday 8am-4pm    Remember:  Do not eat or drink after midnight the night before your surgery     Take these medicines the morning of surgery with A SIP OF WATER: DULoxetine (CYMBALTA) fluticasone (FLONASE) loratadine (CLARITIN) omeprazole (PRILOSEC) acetaminophen (TYLENOL) - if needed   As of today, STOP taking any Aspirin (unless otherwise instructed by your surgeon) Aleve, Naproxen, Ibuprofen, Motrin, Advil, Goody's, BC's, all herbal medications, fish oil, and all vitamins.          Do not wear jewelry or makeup Do not wear lotions, powders, perfumes or deodorant. Do not shave 48 hours prior to surgery.   Do not bring valuables to the hospital. DO Not wear nail polish, gel polish, artificial nails, or any other type of covering on  natural nails including finger and toenails. If patients have artificial nails, gel coating, etc. that need to be removed by a nail salon please have this removed prior to surgery or surgery may need to be canceled/delayed if the surgeon/ anesthesia feels like the patient is unable to be adequately monitored.             [facility] is not responsible for any belongings or valuables.  Do NOT Smoke (Tobacco/Vaping) or drink Alcohol 24 hours prior to your procedure If you use a CPAP at night, you may bring all equipment for your overnight stay.   Contacts, glasses, dentures or bridgework may not be worn into surgery, please bring cases for these belongings   For patients admitted to the hospital, discharge time will be determined by your treatment team.   Patients discharged the day of surgery will not be allowed to drive home,  and someone needs to stay with them for 24 hours.  ONLY 1 SUPPORT PERSON MAY BE PRESENT WHILE YOU ARE IN SURGERY. IF YOU ARE TO BE ADMITTED ONCE YOU ARE IN YOUR ROOM YOU WILL BE ALLOWED TWO (2) VISITORS.  Minor children may have two parents present. Special consideration for safety and communication needs will be reviewed on a case by case basis.  Special instructions:    Oral Hygiene is also important to reduce your risk of infection.  Remember - BRUSH YOUR TEETH THE MORNING OF SURGERY WITH YOUR REGULAR TOOTHPASTE   [facility]- Preparing For Surgery  Before surgery, you can play an important role. Because skin is not sterile, your skin needs to be as free of germs as possible. You can reduce the number of germs on your skin by washing with CHG (chlorahexidine gluconate) Soap before surgery.  CHG is an antiseptic cleaner which kills germs and bonds with the skin to continue killing germs even after washing.     Please do not use if you have an allergy to CHG or antibacterial soaps. If your skin becomes reddened/irritated stop using the CHG.  Do not shave (including legs and underarms) for at least 48 hours prior to first CHG shower. It is OK to shave your face.  Please follow these instructions carefully.     Shower the NIGHT BEFORE SURGERY and the MORNING OF SURGERY with CHG Soap.   If you chose to wash your  hair, wash your hair first as usual with your normal shampoo. After you shampoo, rinse your hair and body thoroughly to remove the shampoo.  Then [employer] and genitals (private parts) with your normal soap and rinse thoroughly to remove soap.  After that Use CHG Soap as you would any other liquid soap. You can apply CHG directly to the skin and wash gently with a scrungie or a clean washcloth.   Apply the CHG Soap to your body ONLY FROM THE NECK DOWN.  Do not use on open wounds or open sores. Avoid contact with your eyes, ears, mouth and genitals (private parts). Wash Face and  genitals (private parts)  with your normal soap.   Wash thoroughly, paying special attention to the area where your surgery will be performed.  Thoroughly rinse your body with warm water from the neck down.  DO NOT shower/wash with your normal soap after using and rinsing off the CHG Soap.  Pat yourself dry with a CLEAN TOWEL.  Wear CLEAN PAJAMAS to bed the night before surgery  Place CLEAN SHEETS on your bed the night before your surgery  DO NOT SLEEP WITH PETS.   Day of Surgery:  Take a shower with CHG soap. Wear Clean/Comfortable clothing the morning of surgery Do not apply any deodorants/lotions.   Remember to brush your teeth WITH YOUR REGULAR TOOTHPASTE.   Please read over the following fact sheets that you were given.

## 2020-10-24 ENCOUNTER — Inpatient Hospital Stay (HOSPITAL_COMMUNITY)
Admission: RE | Admit: 2020-10-24 | Discharge: 2020-10-24 | Disposition: A | Discharge status: Home / Self Care | Payer: BC Managed Care – PPO | Source: Ambulatory Visit

## 2020-10-30 ENCOUNTER — Other Ambulatory Visit (HOSPITAL_COMMUNITY): Payer: BC Managed Care – PPO

## 2020-10-30 ENCOUNTER — Inpatient Hospital Stay (HOSPITAL_COMMUNITY): Admission: RE | Admit: 2020-10-30 | Payer: BC Managed Care – PPO | Source: Ambulatory Visit

## 2020-11-01 ENCOUNTER — Ambulatory Visit (HOSPITAL_COMMUNITY)
Admission: RE | Admit: 2020-11-01 | Payer: BC Managed Care – PPO | Source: Home / Self Care | Admitting: Obstetrics & Gynecology

## 2020-11-01 ENCOUNTER — Encounter (INDEPENDENT_AMBULATORY_CARE_PROVIDER_SITE_OTHER): Payer: Self-pay | Admitting: Adult Health

## 2020-11-01 SURGERY — DILATATION & CURETTAGE/HYSTEROSCOPY WITH MYOSURE
Anesthesia: Choice

## 2020-11-06 ENCOUNTER — Ambulatory Visit (INDEPENDENT_AMBULATORY_CARE_PROVIDER_SITE_OTHER): Payer: BC Managed Care – PPO | Admitting: Adult Health

## 2020-11-09 ENCOUNTER — Other Ambulatory Visit (INDEPENDENT_AMBULATORY_CARE_PROVIDER_SITE_OTHER): Payer: Self-pay | Admitting: Family Medicine

## 2020-11-09 DIAGNOSIS — K219 Gastro-esophageal reflux disease without esophagitis: Secondary | ICD-10-CM

## 2020-11-25 ENCOUNTER — Ambulatory Visit (INDEPENDENT_AMBULATORY_CARE_PROVIDER_SITE_OTHER): Payer: BC Managed Care – PPO | Admitting: Adult Health

## 2020-11-25 ENCOUNTER — Other Ambulatory Visit (INDEPENDENT_AMBULATORY_CARE_PROVIDER_SITE_OTHER): Payer: Self-pay | Admitting: Adult Health

## 2020-11-25 ENCOUNTER — Encounter (INDEPENDENT_AMBULATORY_CARE_PROVIDER_SITE_OTHER): Payer: Self-pay | Admitting: Adult Health

## 2020-11-25 ENCOUNTER — Other Ambulatory Visit: Payer: Self-pay

## 2020-11-25 VITALS — BP 127/85 | HR 87 | Temp 98.2°F | Ht 62.0 in | Wt 291.0 lb

## 2020-11-25 DIAGNOSIS — E8881 Metabolic syndrome: Secondary | ICD-10-CM

## 2020-11-25 DIAGNOSIS — Z6841 Body Mass Index (BMI) 40.0 and over, adult: Secondary | ICD-10-CM

## 2020-11-25 DIAGNOSIS — K219 Gastro-esophageal reflux disease without esophagitis: Secondary | ICD-10-CM | POA: Diagnosis not present

## 2020-11-25 DIAGNOSIS — Z9189 Other specified personal risk factors, not elsewhere classified: Secondary | ICD-10-CM

## 2020-11-25 DIAGNOSIS — E7849 Other hyperlipidemia: Secondary | ICD-10-CM | POA: Diagnosis not present

## 2020-11-25 MED ORDER — SEMAGLUTIDE (1 MG/DOSE) 4 MG/3ML ~~LOC~~ SOPN: 1.0000 mg | PEN_INJECTOR | SUBCUTANEOUS | 0 refills | Status: DC

## 2020-11-25 MED ORDER — OMEPRAZOLE 20 MG PO CPDR: 20.0000 mg | DELAYED_RELEASE_CAPSULE | Freq: Every morning | ORAL | 0 refills | Status: DC

## 2020-11-26 NOTE — Telephone Encounter (Signed)
Prior auth needed

## 2020-11-27 DIAGNOSIS — K219 Gastro-esophageal reflux disease without esophagitis: Secondary | ICD-10-CM | POA: Insufficient documentation

## 2020-11-27 NOTE — Progress Notes (Signed)
Chief Complaint:   OBESITY Kelli Simmons is here to discuss her progress with her obesity treatment plan along with follow-up of her obesity related diagnoses. Kelli Simmons is on keeping a food journal and adhering to recommended goals of 1200 calories and 80 grams of protein and states she is following her eating plan approximately 50% of the time. Kelli Simmons states she is not exercising regularly.  Today's visit was #: 13 Starting weight: 280 lbs Starting date: 12/20/2019 Today's weight: 291 lbs Today's date: 11/25/2020 Total lbs lost to date: 0 Total lbs lost since last in-office visit: 0  Interim History: Kelli Simmons has done extensive travel over the summer.   She was in [place] for a total of 5 weeks - travels with her husband for his job.   She believes that she is tracking intake and hitting goals both about 50% of the time.  Subjective:   1. Insulin resistance She experiences nausea without vomiting, headache with Wegovy 1.7 mg.  Last dose on October 25, 2020.  She reports tolerating Wegovy 1 mg.  Lab Results  Component Value Date   INSULIN 9.7 06/12/2020   INSULIN 14.9 12/20/2019   Lab Results  Component Value Date   HGBA1C 5.0 06/12/2020   2. Gastroesophageal reflux disease, unspecified whether esophagitis present Reasonably well controlled on daily omeprazole.  3. Other hyperlipidemia On 06/12/2020, lipid panel total and LDL above goal.  Lab Results  Component Value Date   ALT 23 06/12/2020   AST 30 06/12/2020   ALKPHOS 116 06/12/2020   BILITOT 0.3 06/12/2020   Lab Results  Component Value Date   CHOL 204 (H) 06/12/2020   HDL 66 06/12/2020   LDLCALC 122 (H) 06/12/2020   TRIG 88 06/12/2020   CHOLHDL 3.1 06/12/2020   4. At risk for deficient intake of food The patient is at a higher than average risk of deficient intake of food due to GERD.   Assessment/Plan:   1. Insulin resistance Kelli Simmons will continue to work on weight loss, exercise, and decreasing simple  carbohydrates to help decrease the risk of diabetes. Kelli Simmons agreed to follow-up with [name] as directed to closely monitor her progress.  Check labs at next office visit.  Will refill Wegovy today.  - Refill Semaglutide, 1 MG/DOSE, 4 MG/3ML SOPN; Inject 1 mg as directed once a week.  Dispense: 3 mL; Refill: 0  2. Gastroesophageal reflux disease, unspecified whether esophagitis present Intensive lifestyle modifications are the first line treatment for this issue. We discussed several lifestyle modifications today and she will continue to work on diet, exercise and weight loss efforts. Orders and follow up as documented in patient record.  Decrease acidic/spicy foods.  Will refill omeprazole today.  Counseling If a person has gastroesophageal reflux disease (GERD), food and stomach acid move back up into the esophagus and cause symptoms or problems such as damage to the esophagus. Anti-reflux measures include: raising the head of the bed, avoiding tight clothing or belts, avoiding eating late at night, not lying down shortly after mealtime, and achieving weight loss. Avoid ASA, NSAID's, caffeine, alcohol, and tobacco.  OTC Pepcid and/or Tums are often very helpful for as needed use.  However, for persisting chronic or daily symptoms, stronger medications like Omeprazole may be needed. You may need to avoid foods and drinks such as: Coffee and tea (with or without caffeine). Drinks that contain alcohol. Energy drinks and sports drinks. Bubbly (carbonated) drinks or sodas. Chocolate and cocoa. Peppermint and mint flavorings. Garlic and onions.  Horseradish. Spicy and acidic foods. These include peppers, chili powder, curry powder, vinegar, hot sauces, and BBQ sauce. Citrus fruit juices and citrus fruits, such as oranges, lemons, and limes. Tomato-based foods. These include red sauce, chili, salsa, and pizza with red sauce. Fried and fatty foods. These include donuts, french fries, potato chips, and  high-fat dressings. High-fat meats. These include hot dogs, rib eye steak, sausage, ham, and bacon.  - Refill omeprazole (PRILOSEC) 20 MG capsule; Take 1 capsule (20 mg total) by mouth in the morning.  Dispense: 90 capsule; Refill: 0  3. Other hyperlipidemia Cardiovascular risk and specific lipid/LDL goals reviewed.  We discussed several lifestyle modifications today and Kelli Simmons will continue to work on diet, exercise and weight loss efforts. Orders and follow up as documented in patient record.  Will check labs at next office visit.  Counseling Intensive lifestyle modifications are the first line treatment for this issue. Dietary changes: Increase soluble fiber. Decrease simple carbohydrates. Exercise changes: Moderate to vigorous-intensity aerobic activity 150 minutes per week if tolerated. Lipid-lowering medications: see documented in medical record.  4. At risk for deficient intake of food Kelli Simmons was given approximately 15 minutes of deficit intake of food prevention counseling today. Kelli Simmons is at risk for eating too few calories based on current food recall. She was encouraged to focus on meeting caloric and protein goals according to her recommended meal plan.    5. Class 3 severe obesity with serious comorbidity and body mass index (BMI) of 50.0 to 59.9 in adult, unspecified obesity type (HCC)  Kelli Simmons is currently in the action stage of change. As such, her goal is to continue with weight loss efforts. She has agreed to keeping a food journal and adhering to recommended goals of 1200 calories and 80 grams of protein.   Fasting labs at next office visit.  Exercise goals: All adults should avoid inactivity. Some physical activity is better than none, and adults who participate in any amount of physical activity gain some health benefits.  Behavioral modification strategies: increasing lean protein intake, decreasing simple carbohydrates, meal planning and cooking strategies, keeping  healthy foods in the home, planning for success, and keeping a strict food journal.  Kelli Simmons has agreed to follow-up with our clinic in 4 weeks, fasting. She was informed of the importance of frequent follow-up visits to maximize her success with intensive lifestyle modifications for her multiple health conditions.   Objective:   Blood pressure 127/85, pulse 87, temperature 98.2 F (36.8 C), height 5\' 2"  (1.575 m), weight 291 lb (132 kg), SpO2 98 %. Body mass index is 53.22 kg/m.  General: Cooperative, alert, well developed, in no acute distress. HEENT: Conjunctivae and lids unremarkable. Cardiovascular: Regular rhythm.  Lungs: Normal work of breathing. Neurologic: No focal deficits.   Lab Results  Component Value Date   CREATININE 0.84 06/12/2020   BUN 12 06/12/2020   NA 141 06/12/2020   K 4.6 06/12/2020   CL 104 06/12/2020   CO2 23 06/12/2020   Lab Results  Component Value Date   ALT 23 06/12/2020   AST 30 06/12/2020   ALKPHOS 116 06/12/2020   BILITOT 0.3 06/12/2020   Lab Results  Component Value Date   HGBA1C 5.0 06/12/2020   HGBA1C 5.5 12/20/2019   Lab Results  Component Value Date   INSULIN 9.7 06/12/2020   INSULIN 14.9 12/20/2019   Lab Results  Component Value Date   CHOL 204 (H) 06/12/2020   HDL 66 06/12/2020   LDLCALC 122 (H)  06/12/2020   TRIG 88 06/12/2020   CHOLHDL 3.1 06/12/2020   Lab Results  Component Value Date   VD25OH 50.9 08/29/2020   VD25OH 37.8 06/12/2020   VD25OH 34.8 12/20/2019   Lab Results  Component Value Date   WBC 5.0 12/20/2019   HGB 14.3 12/20/2019   HCT 45.8 12/20/2019   MCV 96 12/20/2019   PLT 403 12/20/2019   Attestation Statements:   Reviewed by clinician on day of visit: allergies, medications, problem list, medical history, surgical history, family history, social history, and previous encounter notes.  I, Insurance claims handler, CMA, am acting as Energy manager for William Hamburger, NP.  I have reviewed the above  documentation for accuracy and completeness, and I agree with the above. -  Xachary Hambly d. Lashon Hillier, NP-C

## 2020-11-27 NOTE — Telephone Encounter (Signed)
Prior authorization has been started for Ozempic. Will notify patient and provider once response is received.  ?

## 2020-11-27 NOTE — Telephone Encounter (Signed)
This is alterative for Ozempic. I have started prior authorization for Ozempic.

## 2020-12-02 ENCOUNTER — Encounter (INDEPENDENT_AMBULATORY_CARE_PROVIDER_SITE_OTHER): Payer: Self-pay

## 2020-12-18 ENCOUNTER — Other Ambulatory Visit: Payer: Self-pay | Admitting: Obstetrics & Gynecology

## 2020-12-18 ENCOUNTER — Other Ambulatory Visit: Payer: Self-pay

## 2020-12-18 ENCOUNTER — Encounter (HOSPITAL_COMMUNITY): Payer: Self-pay | Admitting: Obstetrics & Gynecology

## 2020-12-18 NOTE — H&P (Signed)
Kelli Simmons is an 60 y.o. female.  60 yo female with recurrent postmenopausal bleeding. Here for hysteroscopy, declined repeat endometrial biopsy in office due to pain.  H/o postmenopausal vag bleeding in 2020, had thick endometrial stripe. Obesity - BMI 52. No famHx of uterus/ colon cancer  Last sono in Jul'20 and prior to that was Mar'20 for PMB. when she had endometrial bx that was normal. She denies any vag bleeding and no cramping since that bleeding episode. Since endometrial stripe was thick but EBX nl, she took Prometrium for 6 months and f/up sono in Jul'20 was unchanged, thick EMS, possible cystic areas just as it showed prior to Prometrium.   Considering obesity and consistently thick endometrium with cystic changes with normal endometrial biopsy in 2020, recommend either repeat biopsy or hysteroscopy D&C and possible polypectomy. advantage of latter option discussed with patient and she would like to proceed with that. Procedure r/b/c d/w pt.  No LMP recorded. (Menstrual status: Other).    Past Medical History:  Diagnosis Date   Back pain    BMI 50.0-59.9, adult (HCC)    Endometrial thickening on ultrasound    GERD (gastroesophageal reflux disease)    Heartburn    History of hiatal hernia    Hypertension    Joint pain    Knee pain    Osteoarthritis    Swallowing difficulty     Past Surgical History:  Procedure Laterality Date   CESAREAN SECTION     CESAREAN SECTION     CHOLECYSTECTOMY     UMBILICAL EXPLORATION     6 mos   UMBILICAL HERNIA REPAIR  2001    Family History  Problem Relation Age of Onset   Hypertension Mother    Hyperlipidemia Mother    Thyroid disease Mother    Depression Mother    Anxiety disorder Mother    Schizophrenia Mother    Obesity Mother    Asthma Neg Hx    Eczema Neg Hx    Allergic Disorder Neg Hx     Social History:  reports that she quit smoking about 32 years ago. Her smoking use included cigarettes. She has a 32.00 pack-year  smoking history. She has never used smokeless tobacco. No history on file for alcohol use and drug use.  Allergies: No Active Allergies  No medications prior to admission.    Review of Systems neg   There were no vitals taken for this visit. Physical Exam Physical exam:  A&O x 3, no acute distress. Pleasant HEENT neg, no thyromegaly Lungs CTA bilat CV RRR, S1S2 normal Abdo soft, non tender, non acute Extr no edema/ tenderness Pelvic Uterus slightly enlarged, cervix nl adnexa nl   No results found for this or any previous visit (from the past 24 hour(s)).  No results found.  Assessment/Plan: 60 yo with vag bleeding and thick endometrial stripe here for hysteroscopy, Myosure polypectomy, D&C.  Risks/complications of surgery reviewed incl infection, bleeding, damage to internal organs including bladder, bowels, ureters, blood vessels, other risks from anesthesia, VTE and delayed complications of any surgery, complications in future surgery reviewed.   Robley Fries 12/18/2020, 8:27 PM

## 2020-12-19 ENCOUNTER — Other Ambulatory Visit: Payer: Self-pay

## 2020-12-19 ENCOUNTER — Encounter (HOSPITAL_COMMUNITY): Payer: Self-pay | Admitting: Obstetrics & Gynecology

## 2020-12-19 ENCOUNTER — Encounter (HOSPITAL_COMMUNITY)
Admission: RE | Disposition: A | Discharge status: Home / Self Care | Payer: Self-pay | Source: Home / Self Care | Attending: Obstetrics & Gynecology

## 2020-12-19 ENCOUNTER — Ambulatory Visit (HOSPITAL_COMMUNITY)
Admission: RE | Admit: 2020-12-19 | Discharge: 2020-12-19 | Disposition: A | Discharge status: Home / Self Care | Payer: BC Managed Care – PPO | Attending: Obstetrics & Gynecology | Admitting: Obstetrics & Gynecology

## 2020-12-19 ENCOUNTER — Ambulatory Visit (HOSPITAL_COMMUNITY): Payer: BC Managed Care – PPO | Admitting: Vascular Surgery

## 2020-12-19 DIAGNOSIS — Z6841 Body Mass Index (BMI) 40.0 and over, adult: Secondary | ICD-10-CM | POA: Insufficient documentation

## 2020-12-19 DIAGNOSIS — E669 Obesity, unspecified: Secondary | ICD-10-CM | POA: Insufficient documentation

## 2020-12-19 DIAGNOSIS — R9389 Abnormal findings on diagnostic imaging of other specified body structures: Secondary | ICD-10-CM | POA: Diagnosis not present

## 2020-12-19 DIAGNOSIS — Z8249 Family history of ischemic heart disease and other diseases of the circulatory system: Secondary | ICD-10-CM | POA: Diagnosis not present

## 2020-12-19 DIAGNOSIS — Z87891 Personal history of nicotine dependence: Secondary | ICD-10-CM | POA: Insufficient documentation

## 2020-12-19 DIAGNOSIS — N840 Polyp of corpus uteri: Secondary | ICD-10-CM | POA: Insufficient documentation

## 2020-12-19 DIAGNOSIS — N950 Postmenopausal bleeding: Secondary | ICD-10-CM | POA: Diagnosis not present

## 2020-12-19 DIAGNOSIS — G8929 Other chronic pain: Secondary | ICD-10-CM

## 2020-12-19 DIAGNOSIS — M25562 Pain in left knee: Secondary | ICD-10-CM

## 2020-12-19 DIAGNOSIS — Z8349 Family history of other endocrine, nutritional and metabolic diseases: Secondary | ICD-10-CM | POA: Insufficient documentation

## 2020-12-19 HISTORY — DX: Gastro-esophageal reflux disease without esophagitis: K21.9

## 2020-12-19 HISTORY — PX: DILATATION & CURETTAGE/HYSTEROSCOPY WITH MYOSURE: SHX6511

## 2020-12-19 HISTORY — DX: Personal history of other diseases of the digestive system: Z87.19

## 2020-12-19 LAB — CBC
HCT: 42.2 % (ref 36.0–46.0)
Hemoglobin: 13.5 g/dL (ref 12.0–15.0)
MCH: 31.6 pg (ref 26.0–34.0)
MCHC: 32 g/dL (ref 30.0–36.0)
MCV: 98.8 fL (ref 80.0–100.0)
Platelets: 371 10*3/uL (ref 150–400)
RBC: 4.27 MIL/uL (ref 3.87–5.11)
RDW: 13.2 % (ref 11.5–15.5)
WBC: 6.2 10*3/uL (ref 4.0–10.5)
nRBC: 0 % (ref 0.0–0.2)

## 2020-12-19 LAB — BASIC METABOLIC PANEL
Anion gap: 5 (ref 5–15)
BUN: 7 mg/dL (ref 6–20)
CO2: 28 mmol/L (ref 22–32)
Calcium: 9 mg/dL (ref 8.9–10.3)
Chloride: 105 mmol/L (ref 98–111)
Creatinine, Ser: 0.87 mg/dL (ref 0.44–1.00)
GFR, Estimated: >60 mL/min (ref >60)
Glucose, Bld: 114 mg/dL — ABNORMAL HIGH (ref 70–99)
Potassium: 4.4 mmol/L (ref 3.5–5.1)
Sodium: 138 mmol/L (ref 135–145)

## 2020-12-19 LAB — TYPE AND SCREEN
ABO/RH(D): A POS
Antibody Screen: NEGATIVE

## 2020-12-19 LAB — ABO/RH: ABO/RH(D): A POS

## 2020-12-19 SURGERY — DILATATION & CURETTAGE/HYSTEROSCOPY WITH MYOSURE
Anesthesia: General | Site: Vagina

## 2020-12-19 MED ORDER — LIDOCAINE-EPINEPHRINE 1 %-1:100000 IJ SOLN
INTRAMUSCULAR | Status: DC | PRN
  Administered 2020-12-19: 20 mL

## 2020-12-19 MED ORDER — LIDOCAINE-EPINEPHRINE 1 %-1:100000 IJ SOLN
INTRAMUSCULAR | Status: AC
  Filled 2020-12-19: qty 1

## 2020-12-19 MED ORDER — LACTATED RINGERS IV SOLN: INTRAVENOUS | Status: DC

## 2020-12-19 MED ORDER — SODIUM CHLORIDE 0.9 % IR SOLN
Status: DC | PRN
  Administered 2020-12-19: 3000 mL

## 2020-12-19 MED ORDER — CHLORHEXIDINE GLUCONATE 0.12 % MT SOLN
OROMUCOSAL | Status: AC
  Administered 2020-12-19: 15 mL via OROMUCOSAL
  Filled 2020-12-19: qty 15

## 2020-12-19 MED ORDER — ORAL CARE MOUTH RINSE: 15.0000 mL | Freq: Once | OROMUCOSAL | Status: AC

## 2020-12-19 MED ORDER — MIDAZOLAM HCL 2 MG/2ML IJ SOLN
INTRAMUSCULAR | Status: AC
  Filled 2020-12-19: qty 2

## 2020-12-19 MED ORDER — 0.9 % SODIUM CHLORIDE (POUR BTL) OPTIME
TOPICAL | Status: DC | PRN
  Administered 2020-12-19: 1000 mL

## 2020-12-19 MED ORDER — LIDOCAINE HCL (CARDIAC) PF 100 MG/5ML IV SOSY
PREFILLED_SYRINGE | INTRAVENOUS | Status: DC | PRN
Start: 2020-12-19 — End: 2020-12-19
  Administered 2020-12-19: 50 mg via INTRAVENOUS

## 2020-12-19 MED ORDER — IBUPROFEN 200 MG PO TABS: 600.0000 mg | ORAL_TABLET | Freq: Four times a day (QID) | ORAL | 0 refills | Status: DC | PRN

## 2020-12-19 MED ORDER — MIDAZOLAM HCL 5 MG/5ML IJ SOLN
INTRAMUSCULAR | Status: DC | PRN
  Administered 2020-12-19: 2 mg via INTRAVENOUS

## 2020-12-19 MED ORDER — FENTANYL CITRATE (PF) 250 MCG/5ML IJ SOLN
INTRAMUSCULAR | Status: AC
  Filled 2020-12-19: qty 5

## 2020-12-19 MED ORDER — PROPOFOL 10 MG/ML IV BOLUS
INTRAVENOUS | Status: DC | PRN
  Administered 2020-12-19: 200 mg via INTRAVENOUS

## 2020-12-19 MED ORDER — DEXAMETHASONE SODIUM PHOSPHATE 4 MG/ML IJ SOLN
INTRAMUSCULAR | Status: DC | PRN
  Administered 2020-12-19: 10 mg via INTRAVENOUS

## 2020-12-19 MED ORDER — CHLORHEXIDINE GLUCONATE 0.12 % MT SOLN: 15.0000 mL | Freq: Once | OROMUCOSAL | Status: AC

## 2020-12-19 MED ORDER — PROPOFOL 10 MG/ML IV BOLUS
INTRAVENOUS | Status: AC
  Filled 2020-12-19: qty 20

## 2020-12-19 MED ORDER — POVIDONE-IODINE 10 % EX SWAB
2.0000 "application " | Freq: Once | CUTANEOUS | Status: AC
  Administered 2020-12-19: 2 via TOPICAL

## 2020-12-19 SURGICAL SUPPLY — 17 items
CANISTER SUCT 3000ML PPV (MISCELLANEOUS) IMPLANT
CATH ROBINSON RED A/P 16FR (CATHETERS) ×2 IMPLANT
DEVICE MYOSURE LITE (MISCELLANEOUS) IMPLANT
DEVICE MYOSURE REACH (MISCELLANEOUS) ×2 IMPLANT
GLOVE SURG ENC MOIS LTX SZ7 (GLOVE) ×2 IMPLANT
GLOVE SURG UNDER POLY LF SZ7 (GLOVE) ×4 IMPLANT
GOWN STRL REUS W/ TWL LRG LVL3 (GOWN DISPOSABLE) ×2 IMPLANT
GOWN STRL REUS W/TWL LRG LVL3 (GOWN DISPOSABLE) ×2
KIT PROCEDURE FLUENT (KITS) ×2 IMPLANT
KIT TURNOVER KIT B (KITS) ×2 IMPLANT
MYOSURE XL FIBROID (MISCELLANEOUS)
PACK VAGINAL MINOR WOMEN LF (CUSTOM PROCEDURE TRAY) ×2 IMPLANT
PAD OB MATERNITY 4.3X12.25 (PERSONAL CARE ITEMS) ×2 IMPLANT
SEAL ROD LENS SCOPE MYOSURE (ABLATOR) ×2 IMPLANT
SYSTEM TISS REMOVAL MYOSURE XL (MISCELLANEOUS) IMPLANT
TOWEL GREEN STERILE FF (TOWEL DISPOSABLE) ×4 IMPLANT
UNDERPAD 30X36 HEAVY ABSORB (UNDERPADS AND DIAPERS) ×2 IMPLANT

## 2020-12-19 NOTE — Op Note (Signed)
Matricia Begnaud  12/19/2020  Preoperative diagnosis: Postmenopausal bleeding, thick endometrial stripe, suspect endometrial polyp  Postop diagnosis: as above.  Procedure: Myosure Hysteroscopic polypectomy and D&C Anesthesia: General and 20 cc 1% Lidocaine with epi paracervical block  Surgeon: Shea Evans, MD  Assistant: na  IV fluids 700 cc LR Estimated blood loss 10 cc   Urine output: straight catheter preop  200 cc Complications none  Condition stable  Disposition PACU  Specimen: Endometrial polyp with endometrial curettings   Procedure  Indication: Postmenopausal bleeding and thick endometrial stripe on ultrasound, prior endometrial biopsy no hyperplasia or cancer. Patient was counseled on risks/ complications including infection, bleeding, damage to internal organs, she understood and agrees, gave informed written consent for Myosure Hysteroscopy and D&C, polypectomy  Patient was brought to the operating room with IV running. Time out was carried out. Antibiotic was not indicated. She underwent general anesthesia without complications. She was given dorsolithotomy position. Parts were prepped and draped in standard fashion. Bladder was catheterized once. Bimanual exam revealed uterus to be anteverted and normal size. Speculum was placed and cervix was grasped with single-tooth tenaculum. Cervical block with 20 cc 1% Lidocaine with Epinephrine given. The uterus was sounded to 8 cm. Cervical os was dilated to 21 [place] dilator. Myosure Hysteroscope was introduced in the uterine cavity under vision, using saline. Findings: thick endometrium, normal tubal ostii, lower segment polyp noted. Myosure Reach used and hysteroscopic polypectomy was performed with excellent visualization. Then curettage of entire endometrium performed with Reach. Hysteroscope was removed. Endometrial curettage was performed with sharp curette as well but minimal tissue obtained by that step. All instruments removed.  Fluid  deficit 65 cc.  Specimen pouch sent to path.   All counts are correct x2. No complications. Patient was made supine dorsal anesthesia and brought to the recovery room in stable condition.  Patient will be discharged home today. Follow up in 2 weeks in office. Warning signs of infection and excessive bleeding reviewed.   I performed this surgery.  V.Summerlyn Fickel, MD.

## 2020-12-19 NOTE — Anesthesia Procedure Notes (Signed)
Procedure Name: LMA Insertion Date/Time: 12/19/2020 4:00 PM Performed by: Gwenyth Allegra, CRNA Pre-anesthesia Checklist: Emergency Drugs available, Patient identified, Suction available, Patient being monitored and Timeout performed Patient Re-evaluated:Patient Re-evaluated prior to induction Oxygen Delivery Method: Circle system utilized Preoxygenation: Pre-oxygenation with 100% oxygen Induction Type: IV induction LMA: LMA inserted LMA Size: 4.0 Number of attempts: 1 Placement Confirmation: breath sounds checked- equal and bilateral and positive ETCO2 Tube secured with: Tape Dental Injury: Teeth and Oropharynx as per pre-operative assessment

## 2020-12-19 NOTE — Anesthesia Postprocedure Evaluation (Signed)
Anesthesia Post Note  Patient: Kelli Simmons  Procedure(s) Performed: DILATATION & CURETTAGE/HYSTEROSCOPY WITH MYOSURE (Vagina )     Patient location during evaluation: PACU Anesthesia Type: General Level of consciousness: awake and alert Pain management: pain level controlled Vital Signs Assessment: post-procedure vital signs reviewed and stable Respiratory status: spontaneous breathing, nonlabored ventilation, respiratory function stable and patient connected to nasal cannula oxygen Cardiovascular status: blood pressure returned to baseline and stable Postop Assessment: no apparent nausea or vomiting Anesthetic complications: no   No notable events documented.  Last Vitals:  Vitals:   12/19/20 1652 12/19/20 1707  BP: 135/68 (!) 141/73  Pulse: 84 82  Resp: 20 17  Temp:    SpO2: 99% 93%    Last Pain:  Vitals:   12/19/20 1707  TempSrc:   PainSc: 0-No pain                 Kennieth Rad

## 2020-12-19 NOTE — Anesthesia Preprocedure Evaluation (Signed)
Anesthesia Evaluation  Patient identified by MRN, date of birth, ID band Patient awake    Reviewed: Allergy & Precautions, NPO status , Patient's Chart, lab work & pertinent test results  Airway Mallampati: II  TM Distance: >3 FB Neck ROM: Full    Dental  (+) Dental Advisory Given   Pulmonary former smoker,    breath sounds clear to auscultation       Cardiovascular hypertension, Pt. on medications  Rhythm:Regular Rate:Normal     Neuro/Psych negative neurological ROS     GI/Hepatic Neg liver ROS, hiatal hernia, GERD  ,  Endo/Other  negative endocrine ROS  Renal/GU negative Renal ROS     Musculoskeletal  (+) Arthritis ,   Abdominal   Peds  Hematology negative hematology ROS (+)   Anesthesia Other Findings   Reproductive/Obstetrics                             Anesthesia Physical Anesthesia Plan  ASA: 3  Anesthesia Plan: General   Post-op Pain Management:    Induction: Intravenous  PONV Risk Score and Plan: 3 and Dexamethasone, Ondansetron, Midazolam and Treatment may vary due to age or medical condition  Airway Management Planned: LMA  Additional Equipment: None  Intra-op Plan:   Post-operative Plan: Extubation in OR  Informed Consent: I have reviewed the patients History and Physical, chart, labs and discussed the procedure including the risks, benefits and alternatives for the proposed anesthesia with the patient or authorized representative who has indicated his/her understanding and acceptance.       Plan Discussed with: CRNA  Anesthesia Plan Comments:         Anesthesia Quick Evaluation

## 2020-12-19 NOTE — Transfer of Care (Signed)
Immediate Anesthesia Transfer of Care Note  Patient: Kelli Simmons  Procedure(s) Performed: DILATATION & CURETTAGE/HYSTEROSCOPY WITH MYOSURE (Vagina )  Patient Location: PACU  Anesthesia Type:General  Level of Consciousness: awake, alert  and oriented  Airway & Oxygen Therapy: Patient Spontanous Breathing  Post-op Assessment: Report given to RN and Post -op Vital signs reviewed and stable  Post vital signs: Reviewed and stable  Last Vitals:  Vitals Value Taken Time  BP 133/79 12/19/20 1637  Temp    Pulse 92 12/19/20 1639  Resp 19 12/19/20 1639  SpO2 99 % 12/19/20 1639  Vitals shown include unvalidated device data.  Last Pain:  Vitals:   12/19/20 1253  TempSrc:   PainSc: 5          Complications: No notable events documented.

## 2020-12-20 ENCOUNTER — Encounter (HOSPITAL_COMMUNITY): Payer: Self-pay | Admitting: Obstetrics & Gynecology

## 2020-12-20 NOTE — Addendum Note (Signed)
Addendum  created 12/20/20 1600 by Marcene Duos, MD   Intraprocedure Event edited, Intraprocedure Staff edited

## 2020-12-23 ENCOUNTER — Other Ambulatory Visit (INDEPENDENT_AMBULATORY_CARE_PROVIDER_SITE_OTHER): Payer: Self-pay | Admitting: Adult Health

## 2020-12-23 ENCOUNTER — Encounter (INDEPENDENT_AMBULATORY_CARE_PROVIDER_SITE_OTHER): Payer: Self-pay | Admitting: Adult Health

## 2020-12-23 ENCOUNTER — Ambulatory Visit (INDEPENDENT_AMBULATORY_CARE_PROVIDER_SITE_OTHER): Payer: BC Managed Care – PPO | Admitting: Adult Health

## 2020-12-23 ENCOUNTER — Other Ambulatory Visit: Payer: Self-pay

## 2020-12-23 VITALS — BP 132/84 | HR 87 | Temp 98.0°F | Ht 62.0 in | Wt 298.0 lb

## 2020-12-23 DIAGNOSIS — E8881 Metabolic syndrome: Secondary | ICD-10-CM

## 2020-12-23 DIAGNOSIS — E559 Vitamin D deficiency, unspecified: Secondary | ICD-10-CM

## 2020-12-23 DIAGNOSIS — E7800 Pure hypercholesterolemia, unspecified: Secondary | ICD-10-CM | POA: Diagnosis not present

## 2020-12-23 DIAGNOSIS — Z9189 Other specified personal risk factors, not elsewhere classified: Secondary | ICD-10-CM | POA: Diagnosis not present

## 2020-12-23 DIAGNOSIS — I10 Essential (primary) hypertension: Secondary | ICD-10-CM

## 2020-12-23 DIAGNOSIS — Z6841 Body Mass Index (BMI) 40.0 and over, adult: Secondary | ICD-10-CM

## 2020-12-23 LAB — SURGICAL PATHOLOGY

## 2020-12-23 MED ORDER — VICTOZA 18 MG/3ML ~~LOC~~ SOPN: 0.6000 mg | PEN_INJECTOR | Freq: Every day | SUBCUTANEOUS | 0 refills | Status: DC

## 2020-12-23 NOTE — Telephone Encounter (Signed)
Prior Auth needed.

## 2020-12-23 NOTE — Telephone Encounter (Signed)
Pt last seen by Katy Danford, FNP.  

## 2020-12-24 ENCOUNTER — Other Ambulatory Visit (INDEPENDENT_AMBULATORY_CARE_PROVIDER_SITE_OTHER): Payer: Self-pay | Admitting: Adult Health

## 2020-12-24 DIAGNOSIS — E8881 Metabolic syndrome: Secondary | ICD-10-CM

## 2020-12-24 LAB — LIPID PANEL
Chol/HDL Ratio: 2.9 ratio (ref 0.0–4.4)
Cholesterol, Total: 205 mg/dL — ABNORMAL HIGH (ref 100–199)
HDL: 71 mg/dL (ref >39)
LDL Chol Calc (NIH): 111 mg/dL — ABNORMAL HIGH (ref 0–99)
Triglycerides: 131 mg/dL (ref 0–149)
VLDL Cholesterol Cal: 23 mg/dL (ref 5–40)

## 2020-12-24 LAB — COMPREHENSIVE METABOLIC PANEL
ALT: 20 IU/L (ref 0–32)
AST: 21 IU/L (ref 0–40)
Albumin/Globulin Ratio: 1.3 (ref 1.2–2.2)
Albumin: 4 g/dL (ref 3.8–4.9)
Alkaline Phosphatase: 113 IU/L (ref 44–121)
BUN/Creatinine Ratio: 11 (ref 9–23)
BUN: 9 mg/dL (ref 6–24)
Bilirubin Total: 0.3 mg/dL (ref 0.0–1.2)
CO2: 27 mmol/L (ref 20–29)
Calcium: 9.7 mg/dL (ref 8.7–10.2)
Chloride: 98 mmol/L (ref 96–106)
Creatinine, Ser: 0.81 mg/dL (ref 0.57–1.00)
Globulin, Total: 3.2 g/dL (ref 1.5–4.5)
Glucose: 100 mg/dL — ABNORMAL HIGH (ref 65–99)
Potassium: 5.1 mmol/L (ref 3.5–5.2)
Sodium: 138 mmol/L (ref 134–144)
Total Protein: 7.2 g/dL (ref 6.0–8.5)
eGFR: 84 mL/min/{1.73_m2} (ref >59)

## 2020-12-24 LAB — HEMOGLOBIN A1C
Est. average glucose Bld gHb Est-mCnc: 120 mg/dL
Hgb A1c MFr Bld: 5.8 % — ABNORMAL HIGH (ref 4.8–5.6)

## 2020-12-24 LAB — INSULIN, RANDOM: INSULIN: 13.7 u[IU]/mL (ref 2.6–24.9)

## 2020-12-24 LAB — VITAMIN D 25 HYDROXY (VIT D DEFICIENCY, FRACTURES): Vit D, 25-Hydroxy: 40.9 ng/mL (ref 30.0–100.0)

## 2020-12-24 NOTE — Telephone Encounter (Signed)
Pt last seen by Katy Danford, FNP.  

## 2020-12-24 NOTE — Telephone Encounter (Signed)
Thank you :)

## 2020-12-24 NOTE — Telephone Encounter (Signed)
Prior authorization has been started for Victoza. Will notify patient and provider once response is received.  

## 2020-12-24 NOTE — Telephone Encounter (Signed)
PA in process

## 2020-12-24 NOTE — Progress Notes (Signed)
Chief Complaint:   OBESITY Kelli Simmons is here to discuss her progress with her obesity treatment plan along with follow-up of her obesity related diagnoses. [name] is on keeping a food journal and adhering to recommended goals of 1200 calories and 80 grams of protein and states she is following her eating plan approximately 50% of the time. [name] states she is going to the gym for 60 minutes 2 times per week.  Today's visit was #: 14 Starting weight: 280 lbs Starting date: 12/20/2019 Today's weight: 298 lbs Today's date: 12/23/2020 Total lbs lost to date: 0 Total lbs lost since last in-office visit: 0  Interim History: Kelli Simmons continues to be challenged to stay on plan when traveling with her husband ([name], [name], [place]).  He will be retiring in 2023.  She estimates to track intake and hit cal/protein goals both at 50% of the time.   Daily calories - 1000-1800, protein 60-70 grams per day.  Subjective:   1. Essential hypertension SBP slightly above goal at office visit. She denies acute cardiac sx's at present.  BP Readings from Last 3 Encounters:  12/23/20 132/84  12/19/20 (!) 121/58  11/25/20 127/85   2. Insulin resistance Previously on Wegovy 1.7 mg once weekly.   She has been off for 6 weeks due to insurance denial. Significant family history of Diabetes- maternal side.    Lab Results  Component Value Date   INSULIN 13.7 12/23/2020   INSULIN 9.7 06/12/2020   INSULIN 14.9 12/20/2019   Lab Results  Component Value Date   HGBA1C 5.8 (H) 12/23/2020   3. Vitamin D deficiency She is currently taking OTC vitamin D 10,000 IU each day along with a multivitamin. She denies nausea, vomiting or muscle weakness.  Lab Results  Component Value Date   VD25OH 40.9 12/23/2020   VD25OH 50.9 08/29/2020   VD25OH 37.8 06/12/2020   4. Pure hypercholesterolemia Kelli Simmons has hyperlipidemia and has been trying to improve her cholesterol levels with intensive lifestyle modification including a  low saturated fat diet, exercise and weight loss. She denies any chest pain, claudication or myalgias.   On 06/12/2020, lipid panel showed a total cholesterol above goal.  She not statin therapy.  Lab Results  Component Value Date   ALT 20 12/23/2020   AST 21 12/23/2020   ALKPHOS 113 12/23/2020   BILITOT 0.3 12/23/2020   Lab Results  Component Value Date   CHOL 205 (H) 12/23/2020   HDL 71 12/23/2020   LDLCALC 111 (H) 12/23/2020   TRIG 131 12/23/2020   CHOLHDL 2.9 12/23/2020   5. At risk for heart disease Kelli Simmons is at a higher than average risk for cardiovascular disease due to obesity, hyperlipidemia, and insulin resistance.    Assessment/Plan:   1. Essential hypertension [name] is working on healthy weight loss and exercise to improve blood pressure control. We will watch for signs of hypotension as she continues her lifestyle modifications.  Check labs today.  - Comprehensive metabolic panel  2. Insulin resistance Kelli Simmons will continue to work on weight loss, exercise, and decreasing simple carbohydrates to help decrease the risk of diabetes. Kelli Simmons agreed to follow-up with [name] as directed to closely monitor her progress.  Start Victoza 0.6 mg subcutaneously daily.  Check labs today.  - Start liraglutide (VICTOZA) 18 MG/3ML SOPN; Inject 0.6 mg into the skin daily.  Dispense: 3 mL; Refill: 0 - Hemoglobin A1c - Insulin, random  3. Vitamin D deficiency Low Vitamin D level contributes to fatigue and  are associated with obesity, breast, and colon cancer. She agrees to continue to take OTC vitamin D 10,000 IU daily.  Will check vitamin D level today.  - VITAMIN D 25 Hydroxy (Vit-D Deficiency, Fractures)  4. Pure hypercholesterolemia Cardiovascular risk and specific lipid/LDL goals reviewed.  We discussed several lifestyle modifications today and Kelli Simmons will continue to work on diet, exercise and weight loss efforts. Orders and follow up as documented in patient record.  Check labs  today.  Counseling Intensive lifestyle modifications are the first line treatment for this issue. Dietary changes: Increase soluble fiber. Decrease simple carbohydrates. Exercise changes: Moderate to vigorous-intensity aerobic activity 150 minutes per week if tolerated. Lipid-lowering medications: see documented in medical record.  - Lipid panel  5. At risk for heart disease [name] was given approximately 15 minutes of coronary artery disease prevention counseling today. She is 60 y.o. female and has risk factors for heart disease including obesity. We discussed intensive lifestyle modifications today with an emphasis on specific weight loss instructions and strategies.   Repetitive spaced learning was employed today to elicit superior memory formation and behavioral change.  6. Class 3 severe obesity with serious comorbidity and body mass index (BMI) of 50.0 to 59.9 in adult, unspecified obesity type (HCC)  Kelli Simmons is currently in the action stage of change. As such, her goal is to continue with weight loss efforts. She has agreed to keeping a food journal and adhering to recommended goals of 1300 calories and 80 grams of protein.   Exercise goals:  As is.  Core exercises.  Behavioral modification strategies: increasing lean protein intake, decreasing simple carbohydrates, meal planning and cooking strategies, better snacking choices, travel eating strategies, planning for success, and keeping a strict food journal.  Have some form of protein at each meal.  Kelli Simmons has agreed to follow-up with our clinic in 3 weeks. She was informed of the importance of frequent follow-up visits to maximize her success with intensive lifestyle modifications for her multiple health conditions.   Objective:   Blood pressure 132/84, pulse 87, temperature 98 F (36.7 C), height 5\' 2"  (1.575 m), weight 298 lb (135.2 kg), SpO2 97 %. Body mass index is 54.5 kg/m.  General: Cooperative, alert, well  developed, in no acute distress. HEENT: Conjunctivae and lids unremarkable. Cardiovascular: Regular rhythm.  Lungs: Normal work of breathing. Neurologic: No focal deficits.   Lab Results  Component Value Date   CREATININE 0.81 12/23/2020   BUN 9 12/23/2020   NA 138 12/23/2020   K 5.1 12/23/2020   CL 98 12/23/2020   CO2 27 12/23/2020   Lab Results  Component Value Date   ALT 20 12/23/2020   AST 21 12/23/2020   ALKPHOS 113 12/23/2020   BILITOT 0.3 12/23/2020   Lab Results  Component Value Date   HGBA1C 5.8 (H) 12/23/2020   HGBA1C 5.0 06/12/2020   HGBA1C 5.5 12/20/2019   Lab Results  Component Value Date   INSULIN 13.7 12/23/2020   INSULIN 9.7 06/12/2020   INSULIN 14.9 12/20/2019   Lab Results  Component Value Date   CHOL 205 (H) 12/23/2020   HDL 71 12/23/2020   LDLCALC 111 (H) 12/23/2020   TRIG 131 12/23/2020   CHOLHDL 2.9 12/23/2020   Lab Results  Component Value Date   VD25OH 40.9 12/23/2020   VD25OH 50.9 08/29/2020   VD25OH 37.8 06/12/2020   Lab Results  Component Value Date   WBC 6.2 12/19/2020   HGB 13.5 12/19/2020   HCT 42.2 12/19/2020  MCV 98.8 12/19/2020   PLT 371 12/19/2020   Attestation Statements:   Reviewed by clinician on day of visit: allergies, medications, problem list, medical history, surgical history, family history, social history, and previous encounter notes.  I, Insurance claims handler, CMA, am acting as Energy manager for William Hamburger, NP.  I have reviewed the above documentation for accuracy and completeness, and I agree with the above. -  Ameyah Bangura d. Arelys Glassco, NP-C

## 2020-12-25 ENCOUNTER — Encounter (INDEPENDENT_AMBULATORY_CARE_PROVIDER_SITE_OTHER): Payer: Self-pay | Admitting: Adult Health

## 2020-12-25 NOTE — Telephone Encounter (Signed)
Last OV with Katy 

## 2020-12-26 ENCOUNTER — Encounter (INDEPENDENT_AMBULATORY_CARE_PROVIDER_SITE_OTHER): Payer: Self-pay

## 2021-01-14 ENCOUNTER — Ambulatory Visit (INDEPENDENT_AMBULATORY_CARE_PROVIDER_SITE_OTHER): Payer: BC Managed Care – PPO | Admitting: Adult Health

## 2021-01-14 ENCOUNTER — Encounter (INDEPENDENT_AMBULATORY_CARE_PROVIDER_SITE_OTHER): Payer: Self-pay | Admitting: Adult Health

## 2021-01-14 ENCOUNTER — Other Ambulatory Visit (INDEPENDENT_AMBULATORY_CARE_PROVIDER_SITE_OTHER): Payer: Self-pay | Admitting: Adult Health

## 2021-01-14 ENCOUNTER — Other Ambulatory Visit: Payer: Self-pay

## 2021-01-14 VITALS — BP 136/83 | HR 94 | Temp 98.4°F | Ht 62.0 in | Wt 299.0 lb

## 2021-01-14 DIAGNOSIS — E559 Vitamin D deficiency, unspecified: Secondary | ICD-10-CM | POA: Diagnosis not present

## 2021-01-14 DIAGNOSIS — E782 Mixed hyperlipidemia: Secondary | ICD-10-CM

## 2021-01-14 DIAGNOSIS — R7303 Prediabetes: Secondary | ICD-10-CM

## 2021-01-14 DIAGNOSIS — Z9189 Other specified personal risk factors, not elsewhere classified: Secondary | ICD-10-CM | POA: Diagnosis not present

## 2021-01-14 DIAGNOSIS — Z6841 Body Mass Index (BMI) 40.0 and over, adult: Secondary | ICD-10-CM

## 2021-01-14 MED ORDER — VITAMIN D (ERGOCALCIFEROL) 1.25 MG (50000 UNIT) PO CAPS
50000.0000 [IU] | ORAL_CAPSULE | ORAL | 0 refills | Status: DC
Start: 2021-01-14 — End: 2021-02-24

## 2021-01-14 MED ORDER — RYBELSUS 3 MG PO TABS: 1.0000 | ORAL_TABLET | Freq: Every day | ORAL | 0 refills | Status: DC

## 2021-01-14 NOTE — Progress Notes (Signed)
Chief Complaint:   OBESITY Kelli Simmons is here to discuss her progress with her obesity treatment plan along with follow-up of her obesity related diagnoses. Kelli Simmons is on "1200 calories and juicing" and states she is following her eating plan approximately 100% of the time. Kelli Simmons states she is going to the gym for 60 minutes 3 times per week.  Today's visit was #: 15 Starting weight: 280 lbs Starting date: 12/20/2019 Today's weight: 299 lbs Today's date: 01/14/2021 Total lbs lost to date: 0 Total lbs lost since last in-office visit: 0  Interim History: Kelli Simmons's home scale recorded a weight of 307 pounds on 01/04/2021. To counteract the weight gain, she began a juicing program. Consuming the following smoothie- 3 x day: Mango, tangerine, strawberry, pineapple, brussels sprouts, spinach, broccoli, peanut butter, and protein powder.   Current weight 299 pounds. 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  13) Her 60 year old granddaughter was hospitalized with SI. Her son is attempting to secure primary custody of his daughter to ensure her safety and well-being.  Subjective:   1. Mixed hyperlipidemia On 12/23/2020, lipid panel - total/LDL both above goal, however, excellent HDL - 71.  Lab Results  Component Value Date   ALT 20 12/23/2020   AST 21 12/23/2020   ALKPHOS 113 12/23/2020   BILITOT 0.3 12/23/2020   Lab Results  Component Value Date   CHOL 205 (H) 12/23/2020   HDL 71 12/23/2020   LDLCALC 111 (H) 12/23/2020   TRIG 131 12/23/2020   CHOLHDL 2.9 12/23/2020   2. Vitamin D deficiency On 12/23/2020, vitamin D level - 40.9 - just below goal of 50. She is on OTC vitamin D3 10,000 IU daily.  Lab Results  Component Value Date   VD25OH 40.9 12/23/2020   VD25OH 50.9 08/29/2020   VD25OH 37.8 06/12/2020   3. Prediabetes On 12/23/2020, BG 100, A1c 5.8, insulin 13.7. On 12/23/2020, CMP  showed GFR >60. When she was on metformin, she was unable to tolerate it due to headache, diffuse pain, and nausea. Last use 2016 - used for 2-3 months.  Lab Results  Component Value Date   HGBA1C 5.8 (H) 12/23/2020   Lab Results  Component Value Date   INSULIN 13.7 12/23/2020   INSULIN 9.7 06/12/2020   INSULIN 14.9 12/20/2019   4. At risk for constipation Hikari is at increased risk for constipation due to GLP-1 for prediabetes.  Assessment/Plan:   1. Mixed hyperlipidemia Discussed labs with patient today.  Continue to decrease saturated fat.  Increase daily walking.  2. Vitamin D deficiency Discussed labs with patient today.  Stop OTC supplement. Start ergocalciferol 50,000 IU once weekly, as per below.  - Start Vitamin D, Ergocalciferol, (DRISDOL) 1.25 MG (50000 UNIT) CAPS capsule; Take 1 capsule (50,000 Units total) by mouth every 7 (seven) days.  Dispense: 4 capsule; Refill: 0  3. Prediabetes Discussed labs with patient today.  Start Rybelsus 3 mg daily.  - Start Semaglutide (RYBELSUS) 3 MG TABS; Take 1 tablet by mouth daily.  Dispense: 30 tablet; Refill: 0  4. At risk for constipation Avilene was given approximately 15 minutes of counseling today regarding prevention of constipation. She was encouraged to increase water and fiber intake.    5. Obesity with current BMI 54.7  [name] is currently in the action stage of change. As such, her goal is to continue with weight loss efforts. She has agreed to  juicing.   Exercise goals:  As is.  Behavioral modification strategies: increasing lean protein intake, decreasing simple carbohydrates, meal planning and cooking strategies, and keeping healthy foods in the home.  Kelli Simmons has agreed to follow-up with our clinic in 3 weeks. She was informed of the importance of frequent follow-up visits to maximize her success with intensive lifestyle modifications for her multiple health conditions.   Objective:   Blood pressure  136/83, pulse 94, temperature 98.4 F (36.9 C), height 5\' 2"  (1.575 m), weight 299 lb (135.6 kg), SpO2 99 %. Body mass index is 54.69 kg/m.  General: Cooperative, alert, well developed, in no acute distress. HEENT: Conjunctivae and lids unremarkable. Cardiovascular: Regular rhythm.  Lungs: Normal work of breathing. Neurologic: No focal deficits.   Lab Results  Component Value Date   CREATININE 0.81 12/23/2020   BUN 9 12/23/2020   NA 138 12/23/2020   K 5.1 12/23/2020   CL 98 12/23/2020   CO2 27 12/23/2020   Lab Results  Component Value Date   ALT 20 12/23/2020   AST 21 12/23/2020   ALKPHOS 113 12/23/2020   BILITOT 0.3 12/23/2020   Lab Results  Component Value Date   HGBA1C 5.8 (H) 12/23/2020   HGBA1C 5.0 06/12/2020   HGBA1C 5.5 12/20/2019   Lab Results  Component Value Date   INSULIN 13.7 12/23/2020   INSULIN 9.7 06/12/2020   INSULIN 14.9 12/20/2019   Lab Results  Component Value Date   CHOL 205 (H) 12/23/2020   HDL 71 12/23/2020   LDLCALC 111 (H) 12/23/2020   TRIG 131 12/23/2020   CHOLHDL 2.9 12/23/2020   Lab Results  Component Value Date   VD25OH 40.9 12/23/2020   VD25OH 50.9 08/29/2020   VD25OH 37.8 06/12/2020   Lab Results  Component Value Date   WBC 6.2 12/19/2020   HGB 13.5 12/19/2020   HCT 42.2 12/19/2020   MCV 98.8 12/19/2020   PLT 371 12/19/2020   Attestation Statements:   Reviewed by clinician on day of visit: allergies, medications, problem list, medical history, surgical history, family history, social history, and previous encounter notes.  I, 12/21/2020, CMA, am acting as Insurance claims handler for Energy manager, NP.  I have reviewed the above documentation for accuracy and completeness, and I agree with the above. -  Rosenda Geffrard d. Terek Bee, NP-C

## 2021-01-14 NOTE — Telephone Encounter (Signed)
Prior authorization has been started for Rybelsus. Will notify patient and provider once a response is received.

## 2021-01-14 NOTE — Telephone Encounter (Signed)
PA needed please

## 2021-01-14 NOTE — Telephone Encounter (Signed)
Pt last seen by Katy Danford, FNP.  

## 2021-01-20 ENCOUNTER — Encounter (INDEPENDENT_AMBULATORY_CARE_PROVIDER_SITE_OTHER): Payer: Self-pay

## 2021-01-26 ENCOUNTER — Encounter (INDEPENDENT_AMBULATORY_CARE_PROVIDER_SITE_OTHER): Payer: Self-pay | Admitting: Adult Health

## 2021-02-10 ENCOUNTER — Telehealth (INDEPENDENT_AMBULATORY_CARE_PROVIDER_SITE_OTHER): Payer: BC Managed Care – PPO | Admitting: Adult Health

## 2021-02-10 ENCOUNTER — Other Ambulatory Visit: Payer: Self-pay

## 2021-02-19 ENCOUNTER — Other Ambulatory Visit (INDEPENDENT_AMBULATORY_CARE_PROVIDER_SITE_OTHER): Payer: Self-pay | Admitting: Adult Health

## 2021-02-19 DIAGNOSIS — K219 Gastro-esophageal reflux disease without esophagitis: Secondary | ICD-10-CM

## 2021-02-19 NOTE — Telephone Encounter (Signed)
Pt last seen by Katy Danford, FNP.  

## 2021-02-24 ENCOUNTER — Other Ambulatory Visit: Payer: Self-pay

## 2021-02-24 ENCOUNTER — Encounter (INDEPENDENT_AMBULATORY_CARE_PROVIDER_SITE_OTHER): Payer: Self-pay | Admitting: Adult Health

## 2021-02-24 ENCOUNTER — Other Ambulatory Visit (INDEPENDENT_AMBULATORY_CARE_PROVIDER_SITE_OTHER): Payer: Self-pay | Admitting: Adult Health

## 2021-02-24 ENCOUNTER — Ambulatory Visit (INDEPENDENT_AMBULATORY_CARE_PROVIDER_SITE_OTHER): Payer: BC Managed Care – PPO | Admitting: Adult Health

## 2021-02-24 VITALS — BP 140/77 | HR 90 | Temp 98.1°F | Ht 62.0 in | Wt 302.0 lb

## 2021-02-24 DIAGNOSIS — K219 Gastro-esophageal reflux disease without esophagitis: Secondary | ICD-10-CM | POA: Diagnosis not present

## 2021-02-24 DIAGNOSIS — R7303 Prediabetes: Secondary | ICD-10-CM

## 2021-02-24 DIAGNOSIS — E559 Vitamin D deficiency, unspecified: Secondary | ICD-10-CM

## 2021-02-24 DIAGNOSIS — Z9189 Other specified personal risk factors, not elsewhere classified: Secondary | ICD-10-CM | POA: Diagnosis not present

## 2021-02-24 DIAGNOSIS — Z6841 Body Mass Index (BMI) 40.0 and over, adult: Secondary | ICD-10-CM

## 2021-02-24 MED ORDER — OZEMPIC (0.25 OR 0.5 MG/DOSE) 2 MG/1.5ML ~~LOC~~ SOPN: 0.2500 mg | PEN_INJECTOR | SUBCUTANEOUS | 0 refills | Status: DC

## 2021-02-24 MED ORDER — VITAMIN D (ERGOCALCIFEROL) 1.25 MG (50000 UNIT) PO CAPS: 50000.0000 [IU] | ORAL_CAPSULE | ORAL | 0 refills | Status: DC

## 2021-02-24 MED ORDER — OMEPRAZOLE 20 MG PO CPDR: 20.0000 mg | DELAYED_RELEASE_CAPSULE | Freq: Every morning | ORAL | 0 refills | Status: DC

## 2021-02-24 NOTE — Progress Notes (Signed)
Chief Complaint:   OBESITY Kelli Simmons is here to discuss her progress with her obesity treatment plan along with follow-up of her obesity related diagnoses. Kelli Simmons is juicing and states she is following her eating plan approximately 100% of the time. Kelli Simmons states she is not exercising regularly at this time.  Today's visit was #: 16 Starting weight: 280 lbs Starting date: 12/20/2019 Today's weight: 302 lbs Today's date: 02/24/2021 Total lbs lost to date: 0 Total lbs lost since last in-office visit: 0  Interim History: Kelli Simmons stopped juicing last week.   She has not been following any prescribed meal plan. She continues to travel with her husband frequently. She has been unable to exercise due to travel and increased diffuse pain.  Subjective:   1. Prediabetes On 12/23/2020, A1c 5.8 - she reporst polyphagia throughout the day. Insurance denied Rybelsus.  She denies family history of MTC or personal history of pancreatitis. We dicussed Contrave to hep with polyphagia- prefer to use a medication that will treat metabolic disorders and help reduce polyphagia.  2. Vitamin D deficiency Vitamin D level - 12/23/2020 - 40.9 - below goal of 50. She is currently taking prescription ergocalciferol 50,000 IU each week. She denies nausea, vomiting or muscle weakness.  3. Gastroesophageal reflux disease, unspecified whether esophagitis present Sx's well controlled with once daily Prilosec 20mg .  4. At risk for nausea Kelli Simmons is at risk for nausea due to GLP-1 for prediabetes treatment.  Assessment/Plan:   1. Prediabetes Start Ozempic 0.25 mg once weekly.  - Start Semaglutide,0.25 or 0.5MG /DOS, (OZEMPIC, 0.25 OR 0.5 MG/DOSE,) 2 MG/1.5ML SOPN; Inject 0.25 mg into the skin once a week.  Dispense: 2 mL; Refill: 0  2. Vitamin D deficiency Refill ergocalciferol 50,000 IU once weekly.  - Refill Vitamin D, Ergocalciferol, (DRISDOL) 1.25 MG (50000 UNIT) CAPS capsule; Take 1 capsule (50,000  Units total) by mouth every 7 (seven) days.  Dispense: 4 capsule; Refill: 0  3. Gastroesophageal reflux disease, unspecified whether esophagitis present Refill Prilosec 20 mg daily.  - Refill omeprazole (PRILOSEC) 20 MG capsule; Take 1 capsule (20 mg total) by mouth in the morning.  Dispense: 90 capsule; Refill: 0  4. At risk for nausea Kelli Simmons was given approximately 15 minutes of nausea prevention counseling today. Kelli Simmons is at risk for nausea due to her new or current medication. She was encouraged to titrate her medication slowly, make sure to stay hydrated, eat smaller portions throughout the day, and avoid high fat meals.   5. Obesity with current BMI 55.4  Kelli Simmons is currently in the action stage of change. As such, her goal is to continue with weight loss efforts. She has agreed to keeping a food journal and adhering to recommended goals of 1200 calories and 80 grams of protein.   Exercise goals:  Increase daily walking.  Behavioral modification strategies: increasing lean protein intake, decreasing simple carbohydrates, meal planning and cooking strategies, keeping healthy foods in the home, and planning for success.  Kelli Simmons has agreed to follow-up with our clinic in 3 weeks. She was informed of the importance of frequent follow-up visits to maximize her success with intensive lifestyle modifications for her multiple health conditions.   Objective:   Blood pressure 140/77, pulse 90, temperature 98.1 F (36.7 C), height 5\' 2"  (1.575 m), weight (!) 302 lb (137 kg), SpO2 96 %. Body mass index is 55.24 kg/m.  General: Cooperative, alert, well developed, in no acute distress. HEENT: Conjunctivae and lids unremarkable. Cardiovascular: Regular rhythm.  Lungs: Normal work of breathing. Neurologic: No focal deficits.   Lab Results  Component Value Date   CREATININE 0.81 12/23/2020   BUN 9 12/23/2020   NA 138 12/23/2020   K 5.1 12/23/2020   CL 98 12/23/2020   CO2 27  12/23/2020   Lab Results  Component Value Date   ALT 20 12/23/2020   AST 21 12/23/2020   ALKPHOS 113 12/23/2020   BILITOT 0.3 12/23/2020   Lab Results  Component Value Date   HGBA1C 5.8 (H) 12/23/2020   HGBA1C 5.0 06/12/2020   HGBA1C 5.5 12/20/2019   Lab Results  Component Value Date   INSULIN 13.7 12/23/2020   INSULIN 9.7 06/12/2020   INSULIN 14.9 12/20/2019   Lab Results  Component Value Date   CHOL 205 (H) 12/23/2020   HDL 71 12/23/2020   LDLCALC 111 (H) 12/23/2020   TRIG 131 12/23/2020   CHOLHDL 2.9 12/23/2020   Lab Results  Component Value Date   VD25OH 40.9 12/23/2020   VD25OH 50.9 08/29/2020   VD25OH 37.8 06/12/2020   Lab Results  Component Value Date   WBC 6.2 12/19/2020   HGB 13.5 12/19/2020   HCT 42.2 12/19/2020   MCV 98.8 12/19/2020   PLT 371 12/19/2020   Attestation Statements:   Reviewed by clinician on day of visit: allergies, medications, problem list, medical history, surgical history, family history, social history, and previous encounter notes.  I, Insurance claims handler, CMA, am acting as Energy manager for William Hamburger, NP.  I have reviewed the above documentation for accuracy and completeness, and I agree with the above. -  Taniesha Glanz d. Annali Lybrand, NP-C

## 2021-02-25 ENCOUNTER — Encounter (INDEPENDENT_AMBULATORY_CARE_PROVIDER_SITE_OTHER): Payer: Self-pay

## 2021-02-25 NOTE — Telephone Encounter (Signed)
Message sent to pt re mail order pharmacy.

## 2021-03-03 ENCOUNTER — Encounter (INDEPENDENT_AMBULATORY_CARE_PROVIDER_SITE_OTHER): Payer: Self-pay

## 2021-03-06 ENCOUNTER — Telehealth (INDEPENDENT_AMBULATORY_CARE_PROVIDER_SITE_OTHER): Payer: Self-pay | Admitting: Adult Health

## 2021-03-06 NOTE — Telephone Encounter (Signed)
Call to [facility] pharmacy to verify dosage of Ozempic.  Spoke with Huntley Dec the pharmacy tech. Dosage verified as to what is in pts chart.  No further questions, call ended.

## 2021-03-06 NOTE — Telephone Encounter (Signed)
Jasmine December from [facility] pharmacy called to get clarification on Ozempic. Pharmacy number is [phone]. Patient last seen by [facility]

## 2021-03-24 ENCOUNTER — Ambulatory Visit (INDEPENDENT_AMBULATORY_CARE_PROVIDER_SITE_OTHER): Payer: BC Managed Care – PPO | Admitting: Adult Health

## 2021-04-08 ENCOUNTER — Other Ambulatory Visit: Payer: Self-pay

## 2021-04-08 ENCOUNTER — Encounter (INDEPENDENT_AMBULATORY_CARE_PROVIDER_SITE_OTHER): Payer: Self-pay | Admitting: Adult Health

## 2021-04-08 ENCOUNTER — Ambulatory Visit (INDEPENDENT_AMBULATORY_CARE_PROVIDER_SITE_OTHER): Payer: BC Managed Care – PPO | Admitting: Adult Health

## 2021-04-08 VITALS — BP 141/83 | HR 86 | Temp 98.0°F | Ht 62.0 in | Wt 314.0 lb

## 2021-04-08 DIAGNOSIS — Z9189 Other specified personal risk factors, not elsewhere classified: Secondary | ICD-10-CM

## 2021-04-08 DIAGNOSIS — E66813 Obesity, class 3: Secondary | ICD-10-CM

## 2021-04-08 DIAGNOSIS — Z6841 Body Mass Index (BMI) 40.0 and over, adult: Secondary | ICD-10-CM

## 2021-04-08 DIAGNOSIS — R7303 Prediabetes: Secondary | ICD-10-CM | POA: Diagnosis not present

## 2021-04-08 DIAGNOSIS — E559 Vitamin D deficiency, unspecified: Secondary | ICD-10-CM | POA: Diagnosis not present

## 2021-04-08 MED ORDER — METFORMIN HCL 500 MG PO TABS: 500.0000 mg | ORAL_TABLET | Freq: Two times a day (BID) | ORAL | 0 refills | Status: DC

## 2021-04-09 ENCOUNTER — Encounter (INDEPENDENT_AMBULATORY_CARE_PROVIDER_SITE_OTHER): Payer: Self-pay | Admitting: Adult Health

## 2021-04-09 LAB — VITAMIN D 25 HYDROXY (VIT D DEFICIENCY, FRACTURES): Vit D, 25-Hydroxy: 39.7 ng/mL (ref 30.0–100.0)

## 2021-04-09 MED ORDER — VITAMIN D (ERGOCALCIFEROL) 1.25 MG (50000 UNIT) PO CAPS: 50000.0000 [IU] | ORAL_CAPSULE | ORAL | 0 refills | Status: DC

## 2021-04-09 NOTE — Progress Notes (Addendum)
Chief Complaint:   OBESITY Kelli Simmons is here to discuss her progress with her obesity treatment plan along with follow-up of her obesity related diagnoses. Kelli Simmons is on keeping a food journal and adhering to recommended goals of 1200 calories and 80 grams of protein and states she is following her eating plan approximately 90% of the time. Kelli Simmons states she is not exercising regularly at this time.  Today's visit was #: 17 Starting weight: 280 lbs Starting date: 12/20/2019 Today's weight: 314 lbs Today's date: 04/08/2021 Total lbs lost to date: 0 Total lbs lost since last in-office visit: 0  Interim History:  Kelli Simmons had her annual physical with her PCP on 03/03/2021. Last office visit with [facility] was on 02/24/2021. Her son was awarded full custody of his 74 year old daughter- he resides in state of [place]. She has completed the bariatric information session at [facility] - June 2022 - she is planning on moving forward with surgery in January 2023.  She has brought [facility] Bariatric paperwork that needs to be completed- accomplished during OV.  Subjective:   1. Vitamin D deficiency On 12/23/2020, vitamin D level - 40.9 - below goal of 50.  2. Prediabetes Insurance previously denied Rybelsus. Ozempic prescription sent in 02/24/2021 - later denied by insurance. On 12/23/2020, A1c 5.8, insulin level 15.7. On 03/03/2021, CMP - GFR >60.  3. At risk for diarrhea Kelli Simmons is at higher risk of diarrhea due to starting metformin for prediabetes.  Assessment/Plan:   1. Vitamin D deficiency Check vitamin D level today.  Refill ergocalciferol after lab - MyChart patient after lab.  - VITAMIN D 25 Hydroxy (Vit-D Deficiency, Fractures)  2. Prediabetes Decrease carbohydrates/sugar, increase protein.  Start metformin 500 mg 1/2 tablet with breakfast for 1 week, then increase to 1 full tablet with breakfast for the second week and hold at this dose.  - Start metFORMIN (GLUCOPHAGE) 500 MG tablet;  1/2  tab with breakfast for on week then increase to full tab second week- hold at this dose  Dispense: 30 tablet; Refill: 0  3. At risk for diarrhea Kelli Simmons was given approximately 15 minutes of diabetes education and counseling today. We discussed intensive lifestyle modifications today with an emphasis on weight loss as well as increasing exercise and decreasing simple carbohydrates in her diet. We also reviewed medication options with an emphasis on risk versus benefit of those discussed.   Repetitive spaced learning was employed today to elicit superior memory formation and behavioral change.   4. Obesity with current BMI 57.5  Kelli Simmons is currently in the action stage of change. As such, her goal is to continue with weight loss efforts. She has agreed to keeping a food journal and adhering to recommended goals of 1200 calories and 80 grams of protein.   Exercise goals: No exercise has been prescribed at this time.  Behavioral modification strategies: increasing lean protein intake, decreasing simple carbohydrates, meal planning and cooking strategies, keeping healthy foods in the home, and planning for success.  Kelli Simmons has agreed to follow-up with our clinic in 4 weeks, fasting. She was informed of the importance of frequent follow-up visits to maximize her success with intensive lifestyle modifications for her multiple health conditions.   Kelli Simmons was informed we would discuss her lab results at her next visit unless there is a critical issue that needs to be addressed sooner. Kelli Simmons agreed to keep her next visit at the agreed upon time to discuss these results.  Objective:   Blood pressure Kelli Simmons Kitchen)  141/83, pulse 86, temperature 98 F (36.7 C), height 5\' 2"  (1.575 m), weight (!) 314 lb (142.4 kg), SpO2 96 %. Body mass index is 57.43 kg/m.  General: Cooperative, alert, well developed, in no acute distress. HEENT: Conjunctivae and lids unremarkable. Cardiovascular: Regular rhythm.  Lungs: Normal  work of breathing. Neurologic: No focal deficits.   Lab Results  Component Value Date   CREATININE 0.81 12/23/2020   BUN 9 12/23/2020   NA 138 12/23/2020   K 5.1 12/23/2020   CL 98 12/23/2020   CO2 27 12/23/2020   Lab Results  Component Value Date   ALT 20 12/23/2020   AST 21 12/23/2020   ALKPHOS 113 12/23/2020   BILITOT 0.3 12/23/2020   Lab Results  Component Value Date   HGBA1C 5.8 (H) 12/23/2020   HGBA1C 5.0 06/12/2020   HGBA1C 5.5 12/20/2019   Lab Results  Component Value Date   INSULIN 13.7 12/23/2020   INSULIN 9.7 06/12/2020   INSULIN 14.9 12/20/2019   Lab Results  Component Value Date   CHOL 205 (H) 12/23/2020   HDL 71 12/23/2020   LDLCALC 111 (H) 12/23/2020   TRIG 131 12/23/2020   CHOLHDL 2.9 12/23/2020   Lab Results  Component Value Date   VD25OH 39.7 04/08/2021   VD25OH 40.9 12/23/2020   VD25OH 50.9 08/29/2020   Lab Results  Component Value Date   WBC 6.2 12/19/2020   HGB 13.5 12/19/2020   HCT 42.2 12/19/2020   MCV 98.8 12/19/2020   PLT 371 12/19/2020   Attestation Statements:   Reviewed by clinician on day of visit: allergies, medications, problem list, medical history, surgical history, family history, social history, and previous encounter notes.  I, 12/21/2020, CMA, am acting as Insurance claims handler for Energy manager, NP.  I have reviewed the above documentation for accuracy and completeness, and I agree with the above. -  Kelsye Loomer d. Eldena Dede, NP-C

## 2021-04-16 ENCOUNTER — Other Ambulatory Visit (INDEPENDENT_AMBULATORY_CARE_PROVIDER_SITE_OTHER): Payer: Self-pay | Admitting: Adult Health

## 2021-04-16 DIAGNOSIS — R7303 Prediabetes: Secondary | ICD-10-CM

## 2021-05-06 ENCOUNTER — Ambulatory Visit (INDEPENDENT_AMBULATORY_CARE_PROVIDER_SITE_OTHER): Payer: BC Managed Care – PPO | Admitting: Adult Health

## 2021-05-07 ENCOUNTER — Other Ambulatory Visit (INDEPENDENT_AMBULATORY_CARE_PROVIDER_SITE_OTHER): Payer: Self-pay | Admitting: Adult Health

## 2021-05-07 DIAGNOSIS — R7303 Prediabetes: Secondary | ICD-10-CM

## 2021-05-07 DIAGNOSIS — E559 Vitamin D deficiency, unspecified: Secondary | ICD-10-CM

## 2021-05-07 MED ORDER — METFORMIN HCL 500 MG PO TABS: 500.0000 mg | ORAL_TABLET | Freq: Every day | ORAL | 0 refills | Status: DC

## 2021-05-07 NOTE — Telephone Encounter (Signed)
LAST APPOINTMENT DATE: 04/08/21 NEXT APPOINTMENT DATE: none scheduled    [id number] [place], [place] - [address] [place] [address] Phone: [phone] Fax: [phone]  [facility] - [place], [place] - [address] [place] [address] Phone: [phone] Fax: [phone]  Patient is requesting a refill of the following medications: Pending Prescriptions:                       Disp   Refills   metFORMIN (GLUCOPHAGE) 500 MG tablet       30 tab*0       Sig: Take 1 tablet (500 mg total) by mouth 2 (two) times          daily with a meal. 1/2 tab with breakfast for on          week then increase to full tab second week- hold          at this dose   Vitamin D, Ergocalciferol, (DRISDOL) 1.25 *4 caps*0       Sig: Take 1 capsule (50,000 Units total) by mouth every 7          (seven) days.   Date last filled: 04/09/21 - Vit. D                           04/08/21 - Metformin Previously prescribed by [facility]  Lab Results      Component                Value               Date                      HGBA1C                   5.8 (H)             12/23/2020                HGBA1C                   5.0                 06/12/2020                HGBA1C                   5.5                 12/20/2019           Lab Results      Component                Value               Date                      LDLCALC                  111 (H)             12/23/2020                CREATININE               0.81  12/23/2020           Lab Results      Component                Value               Date                      VD25OH                   39.7                04/08/2021                VD25OH                   40.9                12/23/2020                VD25OH                   50.9                08/29/2020            BP Readings from Last 3 Encounters: 04/08/21 : (!) 141/83 02/24/21 : 140/77 01/14/21 : 136/83

## 2021-05-23 ENCOUNTER — Other Ambulatory Visit (INDEPENDENT_AMBULATORY_CARE_PROVIDER_SITE_OTHER): Payer: Self-pay | Admitting: Adult Health

## 2021-05-23 DIAGNOSIS — K219 Gastro-esophageal reflux disease without esophagitis: Secondary | ICD-10-CM

## 2021-05-31 ENCOUNTER — Other Ambulatory Visit (INDEPENDENT_AMBULATORY_CARE_PROVIDER_SITE_OTHER): Payer: Self-pay | Admitting: Adult Health

## 2021-05-31 DIAGNOSIS — R7303 Prediabetes: Secondary | ICD-10-CM

## 2021-06-03 DIAGNOSIS — Z8719 Personal history of other diseases of the digestive system: Secondary | ICD-10-CM | POA: Diagnosis not present

## 2021-06-03 DIAGNOSIS — K219 Gastro-esophageal reflux disease without esophagitis: Secondary | ICD-10-CM | POA: Diagnosis not present

## 2021-06-03 DIAGNOSIS — M549 Dorsalgia, unspecified: Secondary | ICD-10-CM | POA: Diagnosis not present

## 2021-06-05 ENCOUNTER — Other Ambulatory Visit (HOSPITAL_COMMUNITY): Payer: Self-pay | Admitting: Surgery

## 2021-06-19 ENCOUNTER — Ambulatory Visit (HOSPITAL_COMMUNITY)
Admission: RE | Admit: 2021-06-19 | Discharge: 2021-06-19 | Disposition: A | Discharge status: Home / Self Care | Payer: Federal, State, Local not specified - PPO | Source: Ambulatory Visit | Attending: Surgery | Admitting: Surgery

## 2021-06-19 ENCOUNTER — Other Ambulatory Visit: Payer: Self-pay

## 2021-06-19 DIAGNOSIS — Z01818 Encounter for other preprocedural examination: Secondary | ICD-10-CM | POA: Diagnosis not present

## 2021-06-19 DIAGNOSIS — K219 Gastro-esophageal reflux disease without esophagitis: Secondary | ICD-10-CM | POA: Diagnosis not present

## 2021-07-02 ENCOUNTER — Encounter (INDEPENDENT_AMBULATORY_CARE_PROVIDER_SITE_OTHER): Payer: Self-pay | Admitting: Adult Health

## 2021-07-02 ENCOUNTER — Other Ambulatory Visit (INDEPENDENT_AMBULATORY_CARE_PROVIDER_SITE_OTHER): Payer: Self-pay | Admitting: Adult Health

## 2021-07-02 ENCOUNTER — Encounter: Payer: Federal, State, Local not specified - PPO | Attending: Surgery | Admitting: Skilled Nursing Facility1

## 2021-07-02 ENCOUNTER — Other Ambulatory Visit: Payer: Self-pay

## 2021-07-02 DIAGNOSIS — Z6841 Body Mass Index (BMI) 40.0 and over, adult: Secondary | ICD-10-CM | POA: Diagnosis not present

## 2021-07-02 DIAGNOSIS — K219 Gastro-esophageal reflux disease without esophagitis: Secondary | ICD-10-CM

## 2021-07-02 NOTE — Progress Notes (Signed)
Nutrition Assessment for Bariatric Surgery ?Medical Nutrition Therapy ?Appt Start Time: 4:30    End Time: 5:49 ? ?Patient was seen on 07/02/2021 for Pre-Operative Nutrition Assessment. Letter of approval faxed to [facility] bariatric surgery program coordinator on 07/02/2021.  ? ?Referral stated Supervised Weight Loss (SWL) visits needed: 0 ? ?Pt completed visits. ?  ?Pt has cleared nutrition requirements.  ? ? ? ?Planned surgery: sleeve gastrectomy  ?Pt expectation of surgery: to lose weight ?Pt expectation of dietitian: to help educate ? ?  ?NUTRITION ASSESSMENT ?  ?Anthropometrics  ?Start weight at [facility]: 320.5 lbs (date: 07/02/2021)  ?Height: 62 in ?BMI: 58.62 kg/m2   ?  ?Clinical  ?Medical hx: Arthritis, GERD, HTN, osteoarthritis, dysphagia  ?Medications: see list  ?Labs: B12 1398 ?Notable signs/symptoms: N/A ?Any previous deficiencies? No ? ?Micronutrient Nutrition Focused Physical Exam: ?Hair: No issues observed ?Eyes: No issues observed ?Mouth: No issues observed ?Neck: No issues observed ?Nails: No issues observed ?Skin: No issues observed ? ?Lifestyle & Dietary Hx ? ?Pt state she has been wanting to have surgery for 8 years but ever had insurance that covered it until now.   ?Pt states her passion is weight lifting.  ?Pt states after losing her daughter she hd a lot of numbness in her legs from a  twisted spine and then had to help her husband from a stroke.  ?Pt states she meditates every morning and then goes to th gym for the recumbent bike for 10 minutes, light weights.  ?Pt states wegovy worked really well for her but then was not able to get it anymore from supply.  ?Pt states she is not worried about what weight she should and should not be.  ?Pt states she would like to be vegetarian again.  ? ?24-Hr Dietary Recall ?First Meal: smoothie: plant based protein powder + peanut butter powder  + pineapples + strawberries + almond milk ?Snack:  ?Second Meal: skipped or green leaf salad with  chicken ?Snack: fruit ?Third Meal: mandarin chicken  ?Snack: [place] ice ?Beverages: water, coffee ?  ?Estimated Energy Needs ?Calories: 1500 ? ? ? ?NUTRITION DIAGNOSIS  ?Overweight/obesity ([place]-3.3) related to past poor dietary habits and physical inactivity as evidenced by patient w/ planned sleeve gastrectomy surgery following dietary guidelines for continued weight loss. ?  ? ?NUTRITION INTERVENTION  ?Nutrition counseling (C-1) and education (E-2) to facilitate bariatric surgery goals. ? ?Educated pt on micronutrient deficiencies post surgery and strategies to mitigate that risk ?  ?Pre-Op Goals Reviewed with the Patient ?Track food and beverage intake (pen and paper, MyFitness Pal, Baritastic app, etc.) ?Make healthy food choices while monitoring portion sizes ?Consume 3 meals per day or try to eat every 3-5 hours ?Avoid concentrated sugars and fried foods ?Keep sugar & fat in the single digits per serving on food labels ?Practice CHEWING your food (aim for applesauce consistency) ?Practice not drinking 15 minutes before, during, and 30 minutes after each meal and snack ?Avoid all carbonated beverages (ex: soda, sparkling beverages)  ?Limit caffeinated beverages (ex: coffee, tea, energy drinks) ?Avoid all sugar-sweetened beverages (ex: regular soda, sports drinks)  ?Avoid alcohol  ?Aim for 64-100 ounces of FLUID daily (with at least half of fluid intake being plain water)  ?Aim for at least 60-80 grams of PROTEIN daily ?Look for a liquid protein source that contains ?15 g protein and ?5 g carbohydrate (ex: shakes, drinks, shots) ?Make a list of non-food related activities ?Physical activity is an important part of a healthy lifestyle  so keep it moving! The goal is to reach 150 minutes of exercise per week, including cardiovascular and weight baring activity. ?Try alkaline water ? ?*Goals that are bolded indicate the pt would like to start working towards these ? ?Handouts Provided Include  ?Bariatric Surgery  handouts (Nutrition Visits, Pre-Op Goals, Protein Shakes, Vitamins & Minerals) ? ?Learning Style & Readiness for Change ?Teaching method utilized: Visual & Auditory  ?Demonstrated degree of understanding via: Teach Back  ?Readiness Level: action ?Barriers to learning/adherence to lifestyle change: none identified  ?  ?MONITORING & EVALUATION ?Dietary intake, weekly physical activity, body weight, and pre-op goals reached at next nutrition visit.  ?  ?Next Steps  ?Patient is to follow up at [facility] for Pre-Op Class >2 weeks before surgery for further nutrition education.  ?Pt has completed visits. No further supervised visits required/recomended  ?

## 2021-07-03 ENCOUNTER — Other Ambulatory Visit: Payer: Self-pay | Admitting: Surgery

## 2021-07-03 DIAGNOSIS — R109 Unspecified abdominal pain: Secondary | ICD-10-CM

## 2021-07-11 ENCOUNTER — Ambulatory Visit (HOSPITAL_BASED_OUTPATIENT_CLINIC_OR_DEPARTMENT_OTHER)
Admission: RE | Admit: 2021-07-11 | Discharge: 2021-07-11 | Disposition: A | Discharge status: Home / Self Care | Payer: Federal, State, Local not specified - PPO | Source: Ambulatory Visit | Attending: Surgery | Admitting: Surgery

## 2021-07-11 ENCOUNTER — Encounter (HOSPITAL_BASED_OUTPATIENT_CLINIC_OR_DEPARTMENT_OTHER): Payer: Self-pay

## 2021-07-11 ENCOUNTER — Other Ambulatory Visit: Payer: Self-pay

## 2021-07-11 DIAGNOSIS — R109 Unspecified abdominal pain: Secondary | ICD-10-CM | POA: Insufficient documentation

## 2021-07-11 DIAGNOSIS — I700 Atherosclerosis of aorta: Secondary | ICD-10-CM | POA: Diagnosis not present

## 2021-07-11 LAB — POCT I-STAT CREATININE: Creatinine, Ser: 0.9 mg/dL (ref 0.44–1.00)

## 2021-07-11 MED ORDER — IOHEXOL 300 MG/ML  SOLN
100.0000 mL | Freq: Once | INTRAMUSCULAR | Status: AC | PRN
  Administered 2021-07-11: 100 mL via INTRAVENOUS

## 2021-07-16 DIAGNOSIS — M62830 Muscle spasm of back: Secondary | ICD-10-CM | POA: Diagnosis not present

## 2021-07-16 DIAGNOSIS — M9902 Segmental and somatic dysfunction of thoracic region: Secondary | ICD-10-CM | POA: Diagnosis not present

## 2021-07-16 DIAGNOSIS — M9903 Segmental and somatic dysfunction of lumbar region: Secondary | ICD-10-CM | POA: Diagnosis not present

## 2021-07-16 DIAGNOSIS — M9905 Segmental and somatic dysfunction of pelvic region: Secondary | ICD-10-CM | POA: Diagnosis not present

## 2021-07-21 ENCOUNTER — Other Ambulatory Visit: Payer: Self-pay

## 2021-07-21 ENCOUNTER — Ambulatory Visit: Payer: Self-pay | Admitting: Surgery

## 2021-07-21 ENCOUNTER — Encounter: Payer: Federal, State, Local not specified - PPO | Admitting: Skilled Nursing Facility1

## 2021-07-21 DIAGNOSIS — M9902 Segmental and somatic dysfunction of thoracic region: Secondary | ICD-10-CM | POA: Diagnosis not present

## 2021-07-21 DIAGNOSIS — M62830 Muscle spasm of back: Secondary | ICD-10-CM | POA: Diagnosis not present

## 2021-07-21 DIAGNOSIS — M9905 Segmental and somatic dysfunction of pelvic region: Secondary | ICD-10-CM | POA: Diagnosis not present

## 2021-07-21 DIAGNOSIS — M9901 Segmental and somatic dysfunction of cervical region: Secondary | ICD-10-CM | POA: Diagnosis not present

## 2021-07-21 DIAGNOSIS — M9903 Segmental and somatic dysfunction of lumbar region: Secondary | ICD-10-CM | POA: Diagnosis not present

## 2021-07-21 DIAGNOSIS — Z6841 Body Mass Index (BMI) 40.0 and over, adult: Secondary | ICD-10-CM | POA: Diagnosis not present

## 2021-07-21 NOTE — H&P (View-Only) (Signed)
?Kelli Simmons ?[id number]  ? ?Referring Provider: None ? ?Subjective  ? ?Chief Complaint: return weight loss ? ? ?History of Present Illness: ? ?Follow-up scheduled today to discuss findings of the upper GI. We again went over her reflux symptoms and the natural history of this. ? ?Initial visit; Very pleasant 61 year old woman with a history of hypertension, osteoarthritis with joint and back pain, vitamin D deficiency, prediabetes GERD and endometrial polyps who presents for consultation regarding surgical management of morbid obesity. ?She has followed with the [facility] since August 2021, and appears to have actually gained about 34 pounds during her treatment there as of the last note which was from December 2022. ?Over the years she has tried other multiple methods of weight loss including Wegovy, weight watchers, Slim fast diet, low calorie diet, fasting, Metabolife, exercise programs and weight training and has been able to lose anywhere from 5 to 150 pounds with these efforts, but ultimately regained the weight. Lately she has such severe pain in her left knee from osteoarthritis that she has not really been able to be active, but does go to [place] [facility] twice a week and does some resistance training for the upper body. ?She has had several severely stressful events in life including the loss of her daughter to salivary cancer in 2015 after a 5-year battle, as well as caring for her husband in 2019 after hemorrhagic stroke from which he sounds like fortunately has recovered very well. She notes that during these events the stress seems to go to her back and cause back pain, decreasing her mobility in addition to which she gains weight.  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 ? ?Does note a history of some regurgitation if she eats heavy meals or eats right before bedtime, she does take omeprazole over-the-counter as needed, but notes that this has progressively worsened as she has gained weight. Does not have heartburn per se. ? ?Previous abdominal surgery includes C-sections, umbilical hernia repair and umbilical exploration, cholecystectomy. ? ?Family history notable for obesity, schizophrenia, anxiety and depression, thyroid disease, hypertension and hyperlipidemia in her mother. He notes a strong family history of obesity. ? ?She is a former smoker, quit in 1993 after 16 years of 2 packs a day. ? ?Most recent lab work available was in August 2022 including a CMP which is unremarkable, mildly elevated total cholesterol and her lipid panel, normal vitamin D, unremarkable CBC, hemoglobin A1c of 5.8.  ? ?Review of Systems: ?A complete review of systems was obtained from the patient. I have reviewed this information and discussed as appropriate with the patient. See HPI as well for other ROS. ? ?Medical History: ?Past Medical History:  ?Diagnosis Date  ? Anxiety  ? Arthritis  ? GERD (gastroesophageal reflux disease)  ? Hypertension  ? ?There is no problem list on file for this patient. ? ?Past Surgical History:  ?Procedure Laterality Date  ? CESAREAN SECTION 01/11/1976  ?09/18/80  ? CHOLECYSTECTOMY  ? HERNIA REPAIR  ? ? ?No Known Allergies ? ?Current Outpatient Medications on File Prior to Visit  ?Medication Sig Dispense Refill  ? acetaminophen (TYLENOL) 500 MG tablet Take by mouth  ? amLODIPine (NORVASC) 5 MG tablet Take 1 tablet by mouth once daily  ?  DULoxetine (CYMBALTA) 30 MG DR capsule 1 capsule  ? fluticasone propionate (FLONASE) 50 mcg/actuation nasal spray as directed  ? loratadine (CLARITIN) 10 mg tablet Take by mouth  ? ?No current facility-administered medications on file prior to visit.  ? ?Family History  ?Problem Relation Age of Onset  ? Hyperlipidemia (Elevated  cholesterol) Mother  ? High blood pressure (Hypertension) Mother  ? Obesity Mother  ? Obesity Brother  ? High blood pressure (Hypertension) Brother  ? ? ?Social History  ? ?Tobacco Use  ?Smoking Status Former  ? Types: Cigarettes  ?Smokeless Tobacco Never  ? ? ?Social History  ? ?Socioeconomic History  ? Marital status: Married  ?Tobacco Use  ? Smoking status: Former  ?Types: Cigarettes  ? Smokeless tobacco: Never  ?Substance and Sexual Activity  ? Alcohol use: Yes  ? Drug use: Never  ? ?Objective:  ? ?Vitals:  ?07/02/21 1411  ?Pulse: 82  ?Temp: 36.2 ?C (97.2 ?F)  ?Weight: (!) 142.4 kg (314 lb)  ? ?Body mass index is 57.43 kg/m?. ? ?Alert and well-appearing ?Unlabored respirations ? ?Assessment and Plan:  ?Diagnoses and all orders for this visit: ? ?Morbid obesity (CMS-HCC) ? ?Awaiting CT scan to evaluate the lesion noted on upper GI, which is likely kidney stone. Otherwise she has completed all of the bariatric pathway and is ready for insurance submission which does not have to wait for the CT scan. We had a long discussion today about the expected impact of the sleeve gastrectomy on reflux, and indications for gastric bypass as well as risks and long-term sequelae specific to the gastric bypass. She understands there is a chance that her reflux could worsen after sleeve gastrectomy. Questions were welcomed and answered. After thorough discussion, the patient would like to stay with sleeve gastrectomy. I think that this is reasonable as she has a good understanding of the risks and benefits and alternatives. We will proceed with sleeve gastrectomy as planned. ? ?Morbid obesity (CMS-HCC) ?- T4+Free T4 - [facility] ?- Thyroid Stimulating-Hormone (TSH) ?- H. pylori Breath Test - [facility] ?- Iron and Total Iron Binding Capacity (TIBC) ?- Vitamin B12 - [facility] ?- Folate ?- Prothrombin Time (INR) ?- X-ray chest PA and lateral ?- X-ray upper GI ?- ECG 12-lead ?- Ambulatory Referral to Nutrition ?- Ambulatory Referral to  Adult Behavioral Health ? ?Back pain, unspecified back location, unspecified back pain laterality, unspecified chronicity ? ?GERD without esophagitis ? ?Hx of hiatal hernia ? ?Essential hypertension ? ?Arthralgia, unspecified joint ? ?Osteoarthritis, unspecified osteoarthritis type, unspecified site ? ?Dysphagia, unspecified type ? ?Endometrial thickening on ultrasound ? ? ?She is a candidate for sleeve gastrectomy. We discussed the difference between the sleeve and the bypass in terms of long-term sequela. We discussed the surgery including technical aspects, the risks of bleeding, infection, pain, scarring, injury to intra-abdominal structures, staple line leak or abscess, chronic abdominal pain or nausea, new onset or worsened GERD, DVT/PE, pneumonia, heart attack, stroke, death, failure to reach weight loss goals and weight regain, hernia. Discussed the typical pre-, peri-, and postoperative course. Discussed the importance of lifelong behavioral changes to combat the chronic and relapsing disease which is obesity. Questions welcomed and answered to her satisfaction. We will initiate the pathway toward sleeve gastrectomy. ? ?Bernice Mcauliffe AMANDA Lott Seelbach, MD  ?  ?

## 2021-07-21 NOTE — H&P (Signed)
?Kelli Simmons ?[id number]  ? ?Referring Provider: None ? ?Subjective  ? ?Chief Complaint: return weight loss ? ? ?History of Present Illness: ? ?Follow-up scheduled today to discuss findings of the upper GI. We again went over her reflux symptoms and the natural history of this. ? ?Initial visit; Very pleasant 61 year old woman with a history of hypertension, osteoarthritis with joint and back pain, vitamin D deficiency, prediabetes GERD and endometrial polyps who presents for consultation regarding surgical management of morbid obesity. ?She has followed with the [facility] since August 2021, and appears to have actually gained about 34 pounds during her treatment there as of the last note which was from December 2022. ?Over the years she has tried other multiple methods of weight loss including Wegovy, weight watchers, Slim fast diet, low calorie diet, fasting, Metabolife, exercise programs and weight training and has been able to lose anywhere from 5 to 150 pounds with these efforts, but ultimately regained the weight. Lately she has such severe pain in her left knee from osteoarthritis that she has not really been able to be active, but does go to [place] [facility] twice a week and does some resistance training for the upper body. ?She has had several severely stressful events in life including the loss of her daughter to salivary cancer in 2015 after a 5-year battle, as well as caring for her husband in 2019 after hemorrhagic stroke from which he sounds like fortunately has recovered very well. She notes that during these events the stress seems to go to her back and cause back pain, decreasing her mobility in addition to which she gains weight.  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 ? ?Does note a history of some regurgitation if she eats heavy meals or eats right before bedtime, she does take omeprazole over-the-counter as needed, but notes that this has progressively worsened as she has gained weight. Does not have heartburn per se. ? ?Previous abdominal surgery includes C-sections, umbilical hernia repair and umbilical exploration, cholecystectomy. ? ?Family history notable for obesity, schizophrenia, anxiety and depression, thyroid disease, hypertension and hyperlipidemia in her mother. He notes a strong family history of obesity. ? ?She is a former smoker, quit in 1993 after 16 years of 2 packs a day. ? ?Most recent lab work available was in August 2022 including a CMP which is unremarkable, mildly elevated total cholesterol and her lipid panel, normal vitamin D, unremarkable CBC, hemoglobin A1c of 5.8.  ? ?Review of Systems: ?A complete review of systems was obtained from the patient. I have reviewed this information and discussed as appropriate with the patient. See HPI as well for other ROS. ? ?Medical History: ?Past Medical History:  ?Diagnosis Date  ? Anxiety  ? Arthritis  ? GERD (gastroesophageal reflux disease)  ? Hypertension  ? ?There is no problem list on file for this patient. ? ?Past Surgical History:  ?Procedure Laterality Date  ? CESAREAN SECTION 01/11/1976  ?09/18/80  ? CHOLECYSTECTOMY  ? HERNIA REPAIR  ? ? ?No Known Allergies ? ?Current Outpatient Medications on File Prior to Visit  ?Medication Sig Dispense Refill  ? acetaminophen (TYLENOL) 500 MG tablet Take by mouth  ? amLODIPine (NORVASC) 5 MG tablet Take 1 tablet by mouth once daily  ?  DULoxetine (CYMBALTA) 30 MG DR capsule 1 capsule  ? fluticasone propionate (FLONASE) 50 mcg/actuation nasal spray as directed  ? loratadine (CLARITIN) 10 mg tablet Take by mouth  ? ?No current facility-administered medications on file prior to visit.  ? ?Family History  ?Problem Relation Age of Onset  ? Hyperlipidemia (Elevated  cholesterol) Mother  ? High blood pressure (Hypertension) Mother  ? Obesity Mother  ? Obesity Brother  ? High blood pressure (Hypertension) Brother  ? ? ?Social History  ? ?Tobacco Use  ?Smoking Status Former  ? Types: Cigarettes  ?Smokeless Tobacco Never  ? ? ?Social History  ? ?Socioeconomic History  ? Marital status: Married  ?Tobacco Use  ? Smoking status: Former  ?Types: Cigarettes  ? Smokeless tobacco: Never  ?Substance and Sexual Activity  ? Alcohol use: Yes  ? Drug use: Never  ? ?Objective:  ? ?Vitals:  ?07/02/21 1411  ?Pulse: 82  ?Temp: 36.2 ?C (97.2 ?F)  ?Weight: (!) 142.4 kg (314 lb)  ? ?Body mass index is 57.43 kg/m?. ? ?Alert and well-appearing ?Unlabored respirations ? ?Assessment and Plan:  ?Diagnoses and all orders for this visit: ? ?Morbid obesity (CMS-HCC) ? ?Awaiting CT scan to evaluate the lesion noted on upper GI, which is likely kidney stone. Otherwise she has completed all of the bariatric pathway and is ready for insurance submission which does not have to wait for the CT scan. We had a long discussion today about the expected impact of the sleeve gastrectomy on reflux, and indications for gastric bypass as well as risks and long-term sequelae specific to the gastric bypass. She understands there is a chance that her reflux could worsen after sleeve gastrectomy. Questions were welcomed and answered. After thorough discussion, the patient would like to stay with sleeve gastrectomy. I think that this is reasonable as she has a good understanding of the risks and benefits and alternatives. We will proceed with sleeve gastrectomy as planned. ? ?Morbid obesity (CMS-HCC) ?- T4+Free T4 - [facility] ?- Thyroid Stimulating-Hormone (TSH) ?- H. pylori Breath Test - [facility] ?- Iron and Total Iron Binding Capacity (TIBC) ?- Vitamin B12 - [facility] ?- Folate ?- Prothrombin Time (INR) ?- X-ray chest PA and lateral ?- X-ray upper GI ?- ECG 12-lead ?- Ambulatory Referral to Nutrition ?- Ambulatory Referral to  Adult Behavioral Health ? ?Back pain, unspecified back location, unspecified back pain laterality, unspecified chronicity ? ?GERD without esophagitis ? ?Hx of hiatal hernia ? ?Essential hypertension ? ?Arthralgia, unspecified joint ? ?Osteoarthritis, unspecified osteoarthritis type, unspecified site ? ?Dysphagia, unspecified type ? ?Endometrial thickening on ultrasound ? ? ?She is a candidate for sleeve gastrectomy. We discussed the difference between the sleeve and the bypass in terms of long-term sequela. We discussed the surgery including technical aspects, the risks of bleeding, infection, pain, scarring, injury to intra-abdominal structures, staple line leak or abscess, chronic abdominal pain or nausea, new onset or worsened GERD, DVT/PE, pneumonia, heart attack, stroke, death, failure to reach weight loss goals and weight regain, hernia. Discussed the typical pre-, peri-, and postoperative course. Discussed the importance of lifelong behavioral changes to combat the chronic and relapsing disease which is obesity. Questions welcomed and answered to her satisfaction. We will initiate the pathway toward sleeve gastrectomy. ? ?Cloe Sockwell Carlye Grippe, MD  ?  ?

## 2021-07-22 NOTE — Progress Notes (Signed)
Pre-Operative Nutrition Class:   ? ?Patient was seen on 07/21/2021 for Pre-Operative Bariatric Surgery Education at the [facility].   ? ?Surgery date: 08/05/2021 ?Surgery type: Laparoscopic Gastric Sleeve Resection ?Start weight at [facility]: 320.5 ?Weight today: 324.3 ? ?Samples given per MNT protocol. Patient educated on appropriate usage: ?Bariatric Advantage Multivitamin ?Lot #[id number] ?Exp:10/23 ? ?Bariatric Advantage Calcium  ?Lot #[id number] ?Exp:03/02/2022 ? ?Protein Shake Ensure Max ?Lot # [id number] ?Exp: 05/28/2022 ? ?Bariatric Advantage Protein Powder ?Lot # [id number] ?Exp: 02/24 ? ?The following the learning objectives were met by the patient during this course: ?Identify Pre-Op Dietary Goals and will begin 2 weeks pre-operatively ?Identify appropriate sources of fluids and proteins  ?State protein recommendations and appropriate sources pre and post-operatively ?Identify Post-Operative Dietary Goals and will follow for 2 weeks post-operatively ?Identify appropriate multivitamin and calcium sources ?Describe the need for physical activity post-operatively and will follow MD recommendations ?State when to call healthcare provider regarding medication questions or post-operative complications ?When having a diagnosis of diabetes understanding hypoglycemia symptoms and the inclusion of 1 complex carbohydrate per meal ? ?Handouts given during class include: ?Pre-Op Bariatric Surgery Diet Handout ?Protein Shake Handout ?Post-Op Bariatric Surgery Nutrition Handout ?BELT Program Information Flyer ?Support Group Information Flyer ?[facility] Bariatric Supplements Price List ? ?Follow-Up Plan: ?Patient will follow-up at [facility] 2 weeks post operatively for diet advancement per MD.  ?

## 2021-07-28 DIAGNOSIS — M9902 Segmental and somatic dysfunction of thoracic region: Secondary | ICD-10-CM | POA: Diagnosis not present

## 2021-07-28 DIAGNOSIS — M62830 Muscle spasm of back: Secondary | ICD-10-CM | POA: Diagnosis not present

## 2021-07-28 DIAGNOSIS — M9903 Segmental and somatic dysfunction of lumbar region: Secondary | ICD-10-CM | POA: Diagnosis not present

## 2021-07-28 DIAGNOSIS — M9905 Segmental and somatic dysfunction of pelvic region: Secondary | ICD-10-CM | POA: Diagnosis not present

## 2021-07-30 NOTE — Patient Instructions (Addendum)
2 VISITORS  ARE  ALLOWED TO COME WITH YOU AND STAY IN THE WAITING ROOM ONLY DURING PRE OP AND PROCEDURE.   ? ?**NO VISITORS ARE ALLOWED IN THE SHORT STAY AREA OR RECOVERY ROOM!!** ? ?IF YOU WILL BE ADMITTED INTO THE HOSPITAL YOU ARE ALLOWED ONLY 4 SUPPORT PEOPLE DURING VISITATION HOURS ONLY (7 AM -8PM)   ?The support person(s) must pass our screening, gel in and out, and wear a mask at all times, including in the patient?s room. ?Patients must also wear a mask when staff or their support person are in the room. ?Visitors GUEST BADGE MUST BE WORN VISIBLY  ?One adult visitor may remain with you overnight and MUST be in the room by 8 P.M. ?  ? ? Your procedure is scheduled on: 08/05/21 ? ? [facility] ? ?  Report to admitting at   10:30 AM ? ? Call this number if you have problems the morning of surgery [phone] ? ? Do not eat food :After 6:00 PM the night before surgery ? ? you may have the following liquids until _9:30_ AM DAY OF SURGERY ? ?Water ?Black Coffee (sugar ok, NO MILK/CREAM OR CREAMERS)  ?Tea (sugar ok, NO MILK/CREAM OR CREAMERS) regular and decaf                             ?Plain Jell-O (NO RED)                                           ?Fruit ices (not with fruit pulp, NO RED)                                     ?Popsicles (NO RED)                                                                  ?Juice: apple, WHITE grape, WHITE cranberry ?Sports drinks like Gatorade (NO RED) ?Clear broth(vegetable,chicken,beef) ? ?             ? ?  ?  ? ?  ?       If you have questions, please contact your surgeon?s office. ? ?THE G2 DRINK YOU DRINK BEFORE YOU LEAVE HOME WILL BE THE LAST FLUIDS YOU DRINK BEFORE SURGERY THEN NOTHING MORE BY MOUTH. ? ? ?PAIN IS EXPECTED AFTER SURGERY AND WILL NOT BE COMPLETELY ELIMINATED. ? ? AMBULATION AND TYLENOL WILL HELP REDUCE INCISIONAL AND GAS PAIN. MOVEMENT IS KEY! ? ?YOU ARE EXPECTED TO BE OUT OF BED WITHIN 4 HOURS OF ADMISSION TO YOUR PATIENT  ROOM. ? ?SITTING IN THE RECLINER THROUGHOUT THE DAY IS IMPORTANT FOR DRINKING FLUIDS AND MOVING GAS THROUGHOUT THE GI TRACT. ? ?COMPRESSION STOCKINGS SHOULD BE WORN [facility] YOUR [facility]PITAL STAY UNLESS YOU ARE WALKING.  ? ?INCENTIVE SPIROMETER SHOULD BE USED EVERY HOUR WHILE AWAKE TO DECREASE POST-OPERATIVE COMPLICATIONS SUCH AS PNEUMONIA. ? ?WHEN DISCHARGED HOME, IT IS IMPORTANT TO CONTINUE TO WALK EVERY HOUR AND USE THE INCENTIVE SPIROMETER EVERY HOUR.  ? ? ? ? ?  Oral Hygiene is also important to reduce your risk of infection.                                    ?Remember - BRUSH YOUR TEETH THE MORNING OF SURGERY WITH YOUR REGULAR TOOTHPASTE ? ? Do NOT smoke after Midnight ? ? Take these medicines the morning of surgery with A SIP OF WATER: Duloxetine, Claritin, Amlodipine, Flonase, Omeprazole ? ? ?Bring CPAP mask and tubing day of surgery. ?                  ?           You may not have any metal on your body including hair pins, jewelry, and body piercing ? ?           Do not wear make-up, lotions, powders, perfumes/, or deodorant ? ?Do not wear nail polish including gel and S&S, artificial/acrylic nails, or any other type of covering on natural nails including finger and toenails. If you have artificial nails, gel coating, etc. that needs to be removed by a nail salon please have this removed prior to surgery or surgery may need to be canceled/ delayed if the surgeon/ anesthesia feels like they are unable to be safely monitored.  ? ?Do not shave  48 hours prior to surgery.  ? ? ? Do not bring valuables to the hospital. [facility] IS NOT ?            RESPONSIBLE   FOR VALUABLES. ? ? Contacts, dentures or bridgework may not be worn into surgery. ? ? Bring small overnight bag day of surgery. ?  ? ? Special Instructions: Bring a copy of your healthcare power of attorney and living will documents the day of surgery if you haven't scanned them before. ? ?            Please read over the following fact sheets you were  given: IF YOU HAVE QUESTIONS ABOUT YOUR PRE-OP INSTRUCTIONS PLEASE CALL [phone] ? ?   [facility] - Preparing for Surgery ?Before surgery, you can play an important role.  Because skin is not sterile, your skin needs to be as free of germs as possible.  You can reduce the number of germs on your skin by washing with CHG (chlorahexidine gluconate) soap before surgery.  CHG is an antiseptic cleaner which kills germs and bonds with the skin to continue killing germs even after washing. ?Please DO NOT use if you have an allergy to CHG or antibacterial soaps.  If your skin becomes reddened/irritated stop using the CHG and inform your nurse when you arrive at Short Stay. ?Do not shave (including legs and underarms) for at least 48 hours prior to the first CHG shower.   ?Please follow these instructions carefully: ? 1.  Shower with CHG Soap the night before surgery and the  morning of Surgery. ? 2.  If you choose to wash your hair, wash your hair first as usual with your  normal  shampoo. ? 3.  After you shampoo, rinse your hair and body thoroughly to remove the  shampoo.                      ?      4.  Use CHG as you would any other liquid soap.  You can apply chg directly  to the skin and wash  ?  Gently with a scrungie or clean washcloth. ? 5.  Apply the CHG Soap to your body ONLY FROM THE NECK DOWN.   Do not use on face/ open      ?                     Wound or open sores. Avoid contact with eyes, ears mouth and genitals (private parts).  ?                     Engineering geologist,  Genitals (private parts) with your normal soap. ?            6.  Wash thoroughly, paying special attention to the area where your surgery  will be performed. ? 7.  Thoroughly rinse your body with warm water from the neck down. ? 8.  DO NOT shower/wash with your normal soap after using and rinsing off  the CHG Soap. ?               9.  Pat yourself dry with a clean towel. ?           10.  Wear clean pajamas. ?           11.   Place clean sheets on your bed the night of your first shower and do not  sleep with pets. ?Day of Surgery : ?Do not apply any lotions/deodorants the morning of surgery.  Please wear clean clothes to the hospital/surgery center. ? ?FAILURE TO FOLLOW THESE INSTRUCTIONS MAY RESULT IN THE CANCELLATION OF YOUR SURGERY ? ? ? ?________________________________________________________________________  ? ?Incentive Spirometer ? ?An incentive spirometer is a tool that can help keep your lungs clear and active. This tool measures how well you are filling your lungs with each breath. Taking long deep breaths may help reverse or decrease the chance of developing breathing (pulmonary) problems (especially infection) following: ?A long period of time when you are unable to move or be active. ?BEFORE THE PROCEDURE  ?If the spirometer includes an indicator to show your best effort, your nurse or respiratory therapist will set it to a desired goal. ?If possible, sit up straight or lean slightly forward. Try not to slouch. ?Hold the incentive spirometer in an upright position. ?INSTRUCTIONS FOR USE  ?Sit on the edge of your bed if possible, or sit up as far as you can in bed or on a chair. ?Hold the incentive spirometer in an upright position. ?Breathe out normally. ?Place the mouthpiece in your mouth and seal your lips tightly around it. ?Breathe in slowly and as deeply as possible, raising the piston or the ball toward the top of the column. ?Hold your breath for 3-5 seconds or for as long as possible. Allow the piston or ball to fall to the bottom of the column. ?Remove the mouthpiece from your mouth and breathe out normally. ?Rest for a few seconds and repeat Steps 1 through 7 at least 10 times every 1-2 hours when you are awake. Take your time and take a few normal breaths between deep breaths. ?The spirometer may include an indicator to show your best effort. Use the indicator as a goal to work toward during each  repetition. ?After each set of 10 deep breaths, practice coughing to be sure your lungs are clear. If you have an incision (the cut made at the time of surgery), support your incision when coughing by placing a pillo

## 2021-07-31 ENCOUNTER — Encounter (HOSPITAL_COMMUNITY)
Admission: RE | Admit: 2021-07-31 | Discharge: 2021-07-31 | Disposition: A | Discharge status: Home / Self Care | Payer: Federal, State, Local not specified - PPO | Source: Ambulatory Visit | Attending: Surgery | Admitting: Surgery

## 2021-07-31 ENCOUNTER — Encounter (HOSPITAL_COMMUNITY): Payer: Self-pay

## 2021-07-31 ENCOUNTER — Other Ambulatory Visit: Payer: Self-pay

## 2021-07-31 DIAGNOSIS — Z01812 Encounter for preprocedural laboratory examination: Secondary | ICD-10-CM | POA: Diagnosis not present

## 2021-07-31 DIAGNOSIS — Z6841 Body Mass Index (BMI) 40.0 and over, adult: Secondary | ICD-10-CM | POA: Diagnosis not present

## 2021-07-31 HISTORY — DX: Ankylosing spondylitis of unspecified sites in spine: M45.9

## 2021-07-31 LAB — COMPREHENSIVE METABOLIC PANEL
ALT: 21 U/L (ref 0–44)
AST: 30 U/L (ref 15–41)
Albumin: 3.9 g/dL (ref 3.5–5.0)
Alkaline Phosphatase: 86 U/L (ref 38–126)
Anion gap: 9 (ref 5–15)
BUN: 11 mg/dL (ref 6–20)
CO2: 25 mmol/L (ref 22–32)
Calcium: 9 mg/dL (ref 8.9–10.3)
Chloride: 105 mmol/L (ref 98–111)
Creatinine, Ser: 0.78 mg/dL (ref 0.44–1.00)
GFR, Estimated: >60 mL/min (ref >60)
Glucose, Bld: 109 mg/dL — ABNORMAL HIGH (ref 70–99)
Potassium: 3.6 mmol/L (ref 3.5–5.1)
Sodium: 139 mmol/L (ref 135–145)
Total Bilirubin: 0.6 mg/dL (ref 0.3–1.2)
Total Protein: 7.5 g/dL (ref 6.5–8.1)

## 2021-07-31 LAB — CBC WITH DIFFERENTIAL/PLATELET
Abs Immature Granulocytes: 0.01 10*3/uL (ref 0.00–0.07)
Basophils Absolute: 0.1 10*3/uL (ref 0.0–0.1)
Basophils Relative: 1 %
Eosinophils Absolute: 0.2 10*3/uL (ref 0.0–0.5)
Eosinophils Relative: 3 %
HCT: 41.9 % (ref 36.0–46.0)
Hemoglobin: 13.1 g/dL (ref 12.0–15.0)
Immature Granulocytes: 0 %
Lymphocytes Relative: 44 %
Lymphs Abs: 2.6 10*3/uL (ref 0.7–4.0)
MCH: 30.4 pg (ref 26.0–34.0)
MCHC: 31.3 g/dL (ref 30.0–36.0)
MCV: 97.2 fL (ref 80.0–100.0)
Monocytes Absolute: 0.4 10*3/uL (ref 0.1–1.0)
Monocytes Relative: 7 %
Neutro Abs: 2.6 10*3/uL (ref 1.7–7.7)
Neutrophils Relative %: 45 %
Platelets: 439 10*3/uL — ABNORMAL HIGH (ref 150–400)
RBC: 4.31 MIL/uL (ref 3.87–5.11)
RDW: 12.9 % (ref 11.5–15.5)
WBC: 5.8 10*3/uL (ref 4.0–10.5)
nRBC: 0 % (ref 0.0–0.2)

## 2021-07-31 NOTE — Progress Notes (Signed)
Anesthesia note: ? ?Bowel prep reminder:NA ? ?PCP - Dr. Nyoka Cowden ?Cardiologist -no ?Other-  ? ?Chest x-ray - 06/20/21-epic ?EKG - 06/19/21-epic ?Stress Test - no ?ECHO - no ?Cardiac Cath - NA ? ?Pacemaker/ICD device last checked:NA ? ?Sleep Study -  ?CPAP -  ? ?Pt is pre diabetic-NA ?Fasting Blood Sugar -  ?Checks Blood Sugar _____ ? ?Blood Thinner:NA ?Blood Thinner Instructions: ?Aspirin Instructions: ?Last Dose: ? ?Anesthesia review: no ? ?Patient denies shortness of breath, fever, cough and chest pain at PAT appointment ?Pt has a BMI of 59.3 and has some SOB with exertion ? ?Patient verbalized understanding of instructions that were given to them at the PAT appointment. Patient was also instructed that they will need to review over the PAT instructions again at home before surgery. yes ?

## 2021-08-05 ENCOUNTER — Other Ambulatory Visit: Payer: Self-pay

## 2021-08-05 ENCOUNTER — Encounter (HOSPITAL_COMMUNITY): Payer: Self-pay | Admitting: Surgery

## 2021-08-05 ENCOUNTER — Inpatient Hospital Stay (HOSPITAL_COMMUNITY): Payer: Federal, State, Local not specified - PPO | Admitting: Registered Nurse

## 2021-08-05 ENCOUNTER — Encounter (HOSPITAL_COMMUNITY)
Admission: RE | Disposition: A | Discharge status: Home / Self Care | Payer: Self-pay | Source: Home / Self Care | Attending: Surgery

## 2021-08-05 ENCOUNTER — Inpatient Hospital Stay (HOSPITAL_COMMUNITY)
Admission: RE | Admit: 2021-08-05 | Discharge: 2021-08-06 | DRG: 621 | Disposition: A | Discharge status: Home / Self Care | Payer: Federal, State, Local not specified - PPO | Attending: Surgery | Admitting: Surgery

## 2021-08-05 DIAGNOSIS — K660 Peritoneal adhesions (postprocedural) (postinfection): Secondary | ICD-10-CM | POA: Diagnosis present

## 2021-08-05 DIAGNOSIS — E876 Hypokalemia: Secondary | ICD-10-CM | POA: Diagnosis not present

## 2021-08-05 DIAGNOSIS — Z8249 Family history of ischemic heart disease and other diseases of the circulatory system: Secondary | ICD-10-CM | POA: Diagnosis not present

## 2021-08-05 DIAGNOSIS — K219 Gastro-esophageal reflux disease without esophagitis: Secondary | ICD-10-CM | POA: Diagnosis not present

## 2021-08-05 DIAGNOSIS — I10 Essential (primary) hypertension: Secondary | ICD-10-CM | POA: Diagnosis not present

## 2021-08-05 DIAGNOSIS — R1310 Dysphagia, unspecified: Secondary | ICD-10-CM | POA: Diagnosis not present

## 2021-08-05 DIAGNOSIS — R9389 Abnormal findings on diagnostic imaging of other specified body structures: Secondary | ICD-10-CM | POA: Diagnosis not present

## 2021-08-05 DIAGNOSIS — M549 Dorsalgia, unspecified: Secondary | ICD-10-CM | POA: Diagnosis present

## 2021-08-05 DIAGNOSIS — R7303 Prediabetes: Secondary | ICD-10-CM | POA: Diagnosis present

## 2021-08-05 DIAGNOSIS — Z87891 Personal history of nicotine dependence: Secondary | ICD-10-CM

## 2021-08-05 DIAGNOSIS — E559 Vitamin D deficiency, unspecified: Secondary | ICD-10-CM | POA: Diagnosis not present

## 2021-08-05 DIAGNOSIS — Z79899 Other long term (current) drug therapy: Secondary | ICD-10-CM

## 2021-08-05 DIAGNOSIS — Z6841 Body Mass Index (BMI) 40.0 and over, adult: Secondary | ICD-10-CM

## 2021-08-05 DIAGNOSIS — M1990 Unspecified osteoarthritis, unspecified site: Secondary | ICD-10-CM | POA: Diagnosis present

## 2021-08-05 DIAGNOSIS — Z9049 Acquired absence of other specified parts of digestive tract: Secondary | ICD-10-CM

## 2021-08-05 DIAGNOSIS — F419 Anxiety disorder, unspecified: Secondary | ICD-10-CM | POA: Diagnosis not present

## 2021-08-05 HISTORY — PX: UPPER GI ENDOSCOPY: SHX6162

## 2021-08-05 HISTORY — PX: LAPAROSCOPIC GASTRIC SLEEVE RESECTION: SHX5895

## 2021-08-05 LAB — TYPE AND SCREEN
ABO/RH(D): A POS
Antibody Screen: NEGATIVE

## 2021-08-05 SURGERY — GASTRECTOMY, SLEEVE, LAPAROSCOPIC
Anesthesia: General | Site: Esophagus

## 2021-08-05 MED ORDER — GABAPENTIN 100 MG PO CAPS
200.0000 mg | ORAL_CAPSULE | Freq: Two times a day (BID) | ORAL | Status: DC
  Administered 2021-08-05: 200 mg via ORAL
  Filled 2021-08-05: qty 2

## 2021-08-05 MED ORDER — CHLORHEXIDINE GLUCONATE 4 % EX LIQD: 60.0000 mL | Freq: Once | CUTANEOUS | Status: DC

## 2021-08-05 MED ORDER — SCOPOLAMINE 1 MG/3DAYS TD PT72
1.0000 | MEDICATED_PATCH | TRANSDERMAL | Status: DC
  Administered 2021-08-05: 1.5 mg via TRANSDERMAL
  Filled 2021-08-05: qty 1

## 2021-08-05 MED ORDER — DOCUSATE SODIUM 100 MG PO CAPS
100.0000 mg | ORAL_CAPSULE | Freq: Two times a day (BID) | ORAL | Status: DC
  Administered 2021-08-05 – 2021-08-06 (×2): 100 mg via ORAL
  Filled 2021-08-05 (×2): qty 1

## 2021-08-05 MED ORDER — KETAMINE HCL 10 MG/ML IJ SOLN
INTRAMUSCULAR | Status: DC | PRN
  Administered 2021-08-05: 30 mg via INTRAVENOUS
  Administered 2021-08-05: 10 mg via INTRAVENOUS

## 2021-08-05 MED ORDER — AMLODIPINE BESYLATE 5 MG PO TABS
5.0000 mg | ORAL_TABLET | Freq: Every evening | ORAL | Status: DC
Start: 2021-08-05 — End: 2021-08-07
  Administered 2021-08-05 – 2021-08-06 (×2): 5 mg via ORAL
  Filled 2021-08-05 (×2): qty 1

## 2021-08-05 MED ORDER — ACETAMINOPHEN 160 MG/5ML PO SOLN
1000.0000 mg | Freq: Three times a day (TID) | ORAL | Status: DC
  Administered 2021-08-05: 1000 mg via ORAL
  Filled 2021-08-05: qty 40.6

## 2021-08-05 MED ORDER — ENSURE MAX PROTEIN PO LIQD
2.0000 [oz_av] | ORAL | Status: DC
  Administered 2021-08-06 (×2): 2 [oz_av] via ORAL
  Filled 2021-08-05 (×13): qty 330

## 2021-08-05 MED ORDER — PROPOFOL 10 MG/ML IV BOLUS
INTRAVENOUS | Status: DC | PRN
Start: 2021-08-05 — End: 2021-08-05
  Administered 2021-08-05: 150 mg via INTRAVENOUS

## 2021-08-05 MED ORDER — LACTATED RINGERS IR SOLN
Status: DC | PRN
  Administered 2021-08-05: 1000 mL

## 2021-08-05 MED ORDER — TRAMADOL HCL 50 MG PO TABS
50.0000 mg | ORAL_TABLET | Freq: Four times a day (QID) | ORAL | Status: DC | PRN
  Administered 2021-08-05: 50 mg via ORAL
  Filled 2021-08-05: qty 1

## 2021-08-05 MED ORDER — HEPARIN SODIUM (PORCINE) 5000 UNIT/ML IJ SOLN
5000.0000 [IU] | INTRAMUSCULAR | Status: AC
  Administered 2021-08-05: 5000 [IU] via SUBCUTANEOUS
  Filled 2021-08-05: qty 1

## 2021-08-05 MED ORDER — KETAMINE HCL 50 MG/5ML IJ SOSY
PREFILLED_SYRINGE | INTRAMUSCULAR | Status: AC
  Filled 2021-08-05: qty 5

## 2021-08-05 MED ORDER — APREPITANT 40 MG PO CAPS
40.0000 mg | ORAL_CAPSULE | ORAL | Status: AC
  Administered 2021-08-05: 40 mg via ORAL
  Filled 2021-08-05: qty 1

## 2021-08-05 MED ORDER — PANTOPRAZOLE SODIUM 40 MG IV SOLR
40.0000 mg | Freq: Every day | INTRAVENOUS | Status: DC
  Administered 2021-08-05: 40 mg via INTRAVENOUS
  Filled 2021-08-05: qty 10

## 2021-08-05 MED ORDER — ACETAMINOPHEN 500 MG PO TABS
1000.0000 mg | ORAL_TABLET | Freq: Three times a day (TID) | ORAL | Status: DC
  Administered 2021-08-06 (×2): 1000 mg via ORAL
  Filled 2021-08-05 (×3): qty 2

## 2021-08-05 MED ORDER — HYDRALAZINE HCL 20 MG/ML IJ SOLN
10.0000 mg | INTRAMUSCULAR | Status: DC | PRN
  Administered 2021-08-06 (×2): 10 mg via INTRAVENOUS
  Filled 2021-08-05 (×2): qty 1

## 2021-08-05 MED ORDER — SODIUM CHLORIDE 0.9 % IV SOLN: INTRAVENOUS | Status: DC

## 2021-08-05 MED ORDER — ENOXAPARIN SODIUM 30 MG/0.3ML IJ SOSY
30.0000 mg | PREFILLED_SYRINGE | Freq: Two times a day (BID) | INTRAMUSCULAR | Status: DC
  Administered 2021-08-06 (×2): 30 mg via SUBCUTANEOUS
  Filled 2021-08-05 (×2): qty 0.3

## 2021-08-05 MED ORDER — LIDOCAINE 2% (20 MG/ML) 5 ML SYRINGE
INTRAMUSCULAR | Status: DC | PRN
Start: 2021-08-05 — End: 2021-08-05
  Administered 2021-08-05: 1.5 mg/kg/h via INTRAVENOUS
  Administered 2021-08-05: 80 mg via INTRAVENOUS

## 2021-08-05 MED ORDER — BUPIVACAINE LIPOSOME 1.3 % IJ SUSP
INTRAMUSCULAR | Status: AC
  Filled 2021-08-05: qty 20

## 2021-08-05 MED ORDER — BUPIVACAINE-EPINEPHRINE 0.25% -1:200000 IJ SOLN
INTRAMUSCULAR | Status: DC | PRN
  Administered 2021-08-05: 30 mL

## 2021-08-05 MED ORDER — MIDAZOLAM HCL 5 MG/5ML IJ SOLN
INTRAMUSCULAR | Status: DC | PRN
  Administered 2021-08-05: 2 mg via INTRAVENOUS

## 2021-08-05 MED ORDER — PHENYLEPHRINE HCL-NACL 20-0.9 MG/250ML-% IV SOLN
INTRAVENOUS | Status: DC | PRN
  Administered 2021-08-05: 25 ug/min via INTRAVENOUS

## 2021-08-05 MED ORDER — SODIUM CHLORIDE 0.9 % IV SOLN
2.0000 g | INTRAVENOUS | Status: AC
  Administered 2021-08-05: 2 g via INTRAVENOUS
  Filled 2021-08-05: qty 2

## 2021-08-05 MED ORDER — ENOXAPARIN (LOVENOX) PATIENT EDUCATION KIT
PACK | Freq: Once | Status: AC
  Filled 2021-08-05: qty 1

## 2021-08-05 MED ORDER — SUGAMMADEX SODIUM 500 MG/5ML IV SOLN
INTRAVENOUS | Status: AC
  Filled 2021-08-05: qty 5

## 2021-08-05 MED ORDER — FENTANYL CITRATE (PF) 100 MCG/2ML IJ SOLN
INTRAMUSCULAR | Status: DC | PRN
  Administered 2021-08-05 (×2): 50 ug via INTRAVENOUS

## 2021-08-05 MED ORDER — FENTANYL CITRATE (PF) 100 MCG/2ML IJ SOLN
INTRAMUSCULAR | Status: AC
  Filled 2021-08-05: qty 2

## 2021-08-05 MED ORDER — HYDROMORPHONE HCL 1 MG/ML IJ SOLN
0.5000 mg | INTRAMUSCULAR | Status: DC | PRN
  Filled 2021-08-05: qty 0.5

## 2021-08-05 MED ORDER — ORAL CARE MOUTH RINSE: 15.0000 mL | Freq: Once | OROMUCOSAL | Status: AC

## 2021-08-05 MED ORDER — ALBUTEROL SULFATE HFA 108 (90 BASE) MCG/ACT IN AERS
INHALATION_SPRAY | RESPIRATORY_TRACT | Status: DC | PRN
  Administered 2021-08-05: 3 via RESPIRATORY_TRACT

## 2021-08-05 MED ORDER — LIDOCAINE HCL (PF) 2 % IJ SOLN
INTRAMUSCULAR | Status: AC
  Filled 2021-08-05: qty 5

## 2021-08-05 MED ORDER — ONDANSETRON HCL 4 MG/2ML IJ SOLN
4.0000 mg | INTRAMUSCULAR | Status: DC | PRN
  Filled 2021-08-05: qty 2

## 2021-08-05 MED ORDER — MIDAZOLAM HCL 2 MG/2ML IJ SOLN
INTRAMUSCULAR | Status: AC
Start: 2021-08-05 — End: ?
  Filled 2021-08-05: qty 2

## 2021-08-05 MED ORDER — CHLORHEXIDINE GLUCONATE 0.12 % MT SOLN
15.0000 mL | Freq: Once | OROMUCOSAL | Status: AC
  Administered 2021-08-05: 15 mL via OROMUCOSAL

## 2021-08-05 MED ORDER — ROCURONIUM BROMIDE 10 MG/ML (PF) SYRINGE
PREFILLED_SYRINGE | INTRAVENOUS | Status: AC
  Filled 2021-08-05: qty 10

## 2021-08-05 MED ORDER — ROCURONIUM BROMIDE 10 MG/ML (PF) SYRINGE
PREFILLED_SYRINGE | INTRAVENOUS | Status: DC | PRN
Start: 2021-08-05 — End: 2021-08-05
  Administered 2021-08-05: 100 mg via INTRAVENOUS

## 2021-08-05 MED ORDER — ONDANSETRON HCL 4 MG/2ML IJ SOLN
INTRAMUSCULAR | Status: AC
  Filled 2021-08-05: qty 2

## 2021-08-05 MED ORDER — LACTATED RINGERS IV SOLN: INTRAVENOUS | Status: DC

## 2021-08-05 MED ORDER — MELATONIN 5 MG PO TABS: 10.0000 mg | ORAL_TABLET | Freq: Every evening | ORAL | Status: DC | PRN

## 2021-08-05 MED ORDER — 0.9 % SODIUM CHLORIDE (POUR BTL) OPTIME
TOPICAL | Status: DC | PRN
  Administered 2021-08-05: 1000 mL

## 2021-08-05 MED ORDER — PHENYLEPHRINE HCL (PRESSORS) 10 MG/ML IV SOLN
INTRAVENOUS | Status: AC
  Filled 2021-08-05: qty 1

## 2021-08-05 MED ORDER — LIDOCAINE HCL 2 % IJ SOLN
INTRAMUSCULAR | Status: AC
  Filled 2021-08-05: qty 20

## 2021-08-05 MED ORDER — DEXAMETHASONE SODIUM PHOSPHATE 10 MG/ML IJ SOLN
INTRAMUSCULAR | Status: AC
  Filled 2021-08-05: qty 1

## 2021-08-05 MED ORDER — OXYCODONE HCL 5 MG/5ML PO SOLN: 5.0000 mg | Freq: Four times a day (QID) | ORAL | Status: DC | PRN

## 2021-08-05 MED ORDER — GABAPENTIN 300 MG PO CAPS
300.0000 mg | ORAL_CAPSULE | ORAL | Status: AC
  Administered 2021-08-05: 300 mg via ORAL
  Filled 2021-08-05: qty 1

## 2021-08-05 MED ORDER — EPHEDRINE SULFATE-NACL 50-0.9 MG/10ML-% IV SOSY
PREFILLED_SYRINGE | INTRAVENOUS | Status: DC | PRN
Start: 2021-08-05 — End: 2021-08-05
  Administered 2021-08-05 (×2): 5 mg via INTRAVENOUS
  Administered 2021-08-05: 10 mg via INTRAVENOUS

## 2021-08-05 MED ORDER — ALBUTEROL SULFATE HFA 108 (90 BASE) MCG/ACT IN AERS
INHALATION_SPRAY | RESPIRATORY_TRACT | Status: AC
  Filled 2021-08-05: qty 6.7

## 2021-08-05 MED ORDER — DULOXETINE HCL 60 MG PO CPEP
60.0000 mg | ORAL_CAPSULE | Freq: Every morning | ORAL | Status: DC
  Administered 2021-08-06: 60 mg via ORAL
  Filled 2021-08-05: qty 1

## 2021-08-05 MED ORDER — SIMETHICONE 80 MG PO CHEW: 80.0000 mg | CHEWABLE_TABLET | Freq: Four times a day (QID) | ORAL | Status: DC | PRN

## 2021-08-05 MED ORDER — BUPIVACAINE LIPOSOME 1.3 % IJ SUSP
20.0000 mL | Freq: Once | INTRAMUSCULAR | Status: AC
  Administered 2021-08-05: 20 mL

## 2021-08-05 MED ORDER — METOCLOPRAMIDE HCL 5 MG/ML IJ SOLN
10.0000 mg | Freq: Four times a day (QID) | INTRAMUSCULAR | Status: DC
  Administered 2021-08-05 – 2021-08-06 (×5): 10 mg via INTRAVENOUS
  Filled 2021-08-05 (×5): qty 2

## 2021-08-05 MED ORDER — ACETAMINOPHEN 500 MG PO TABS
1000.0000 mg | ORAL_TABLET | ORAL | Status: AC
  Administered 2021-08-05: 1000 mg via ORAL
  Filled 2021-08-05: qty 2

## 2021-08-05 MED ORDER — METHOCARBAMOL 500 MG IVPB - SIMPLE MED
500.0000 mg | Freq: Four times a day (QID) | INTRAVENOUS | Status: DC | PRN
  Filled 2021-08-05: qty 50

## 2021-08-05 MED ORDER — METOPROLOL TARTRATE 5 MG/5ML IV SOLN: 5.0000 mg | Freq: Four times a day (QID) | INTRAVENOUS | Status: DC | PRN

## 2021-08-05 MED ORDER — PROPOFOL 10 MG/ML IV BOLUS
INTRAVENOUS | Status: AC
  Filled 2021-08-05: qty 20

## 2021-08-05 MED ORDER — SUGAMMADEX SODIUM 500 MG/5ML IV SOLN
INTRAVENOUS | Status: DC | PRN
  Administered 2021-08-05: 400 mg via INTRAVENOUS

## 2021-08-05 MED ORDER — BUPIVACAINE-EPINEPHRINE (PF) 0.25% -1:200000 IJ SOLN
INTRAMUSCULAR | Status: AC
  Filled 2021-08-05: qty 30

## 2021-08-05 SURGICAL SUPPLY — 72 items
APPLIER CLIP ROT 10 11.4 M/L (STAPLE)
APPLIER CLIP ROT 13.4 12 LRG (CLIP)
BAG COUNTER SPONGE SURGICOUNT (BAG) IMPLANT
BAG LAPAROSCOPIC 12 15 PORT 16 (BASKET) IMPLANT
BAG RETRIEVAL 12/15 (BASKET) ×3
BENZOIN TINCTURE PRP APPL 2/3 (GAUZE/BANDAGES/DRESSINGS) ×3 IMPLANT
BLADE SURG SZ11 CARB STEEL (BLADE) ×3 IMPLANT
BNDG ADH 1X3 SHEER STRL LF (GAUZE/BANDAGES/DRESSINGS) ×18 IMPLANT
CABLE HIGH FREQUENCY MONO STRZ (ELECTRODE) IMPLANT
CHLORAPREP W/TINT 26 (MISCELLANEOUS) ×6 IMPLANT
CLIP APPLIE ROT 10 11.4 M/L (STAPLE) IMPLANT
CLIP APPLIE ROT 13.4 12 LRG (CLIP) IMPLANT
COVER SURGICAL LIGHT HANDLE (MISCELLANEOUS) ×3 IMPLANT
DEVICE SUT QUICK LOAD TK 5 (STAPLE) IMPLANT
DEVICE SUT TI-KNOT TK 5X26 (MISCELLANEOUS) IMPLANT
DRAPE UTILITY XL STRL (DRAPES) ×6 IMPLANT
ELECT REM PT RETURN 15FT ADLT (MISCELLANEOUS) ×3 IMPLANT
GAUZE SPONGE 4X4 12PLY STRL (GAUZE/BANDAGES/DRESSINGS) IMPLANT
GLOVE BIO SURGEON STRL SZ 6 (GLOVE) ×3 IMPLANT
GLOVE INDICATOR 6.5 STRL GRN (GLOVE) ×3 IMPLANT
GLOVE SS BIOGEL STRL SZ 6 (GLOVE) ×2 IMPLANT
GLOVE SUPERSENSE BIOGEL SZ 6 (GLOVE) ×1
GOWN STRL REUS W/ TWL LRG LVL3 (GOWN DISPOSABLE) ×2 IMPLANT
GOWN STRL REUS W/ TWL XL LVL3 (GOWN DISPOSABLE) IMPLANT
GOWN STRL REUS W/TWL LRG LVL3 (GOWN DISPOSABLE) ×3
GOWN STRL REUS W/TWL XL LVL3 (GOWN DISPOSABLE) ×1
GRASPER SUT TROCAR 14GX15 (MISCELLANEOUS) ×3 IMPLANT
IRRIG SUCT STRYKERFLOW 2 WTIP (MISCELLANEOUS) ×3
IRRIGATION SUCT STRKRFLW 2 WTP (MISCELLANEOUS) ×2 IMPLANT
KIT BASIN OR (CUSTOM PROCEDURE TRAY) ×3 IMPLANT
KIT TURNOVER KIT A (KITS) ×1 IMPLANT
MARKER SKIN DUAL TIP RULER LAB (MISCELLANEOUS) ×3 IMPLANT
MAT PREVALON FULL STRYKER (MISCELLANEOUS) ×3 IMPLANT
NDL SPNL 22GX3.5 QUINCKE BK (NEEDLE) ×2 IMPLANT
NEEDLE SPNL 22GX3.5 QUINCKE BK (NEEDLE) ×3 IMPLANT
PACK UNIVERSAL I (CUSTOM PROCEDURE TRAY) ×3 IMPLANT
RELOAD ENDO STITCH (ENDOMECHANICALS) IMPLANT
RELOAD STAPLE 60 3.6 BLU REG (STAPLE) ×2 IMPLANT
RELOAD STAPLE 60 3.8 GOLD REG (STAPLE) ×2 IMPLANT
RELOAD STAPLE 60 4.1 GRN THCK (STAPLE) IMPLANT
RELOAD STAPLER BLUE 60MM (STAPLE) ×8 IMPLANT
RELOAD STAPLER GOLD 60MM (STAPLE) ×4 IMPLANT
RELOAD STAPLER GREEN 60MM (STAPLE) IMPLANT
RELOAD SUT TRIPLE-STITCH 2-0 (ENDOMECHANICALS) IMPLANT
SCISSORS LAP 5X45 EPIX DISP (ENDOMECHANICALS) ×3 IMPLANT
SET TUBE SMOKE EVAC HIGH FLOW (TUBING) ×3 IMPLANT
SHEARS HARMONIC ACE PLUS 45CM (MISCELLANEOUS) ×3 IMPLANT
SLEEVE ADV FIXATION 5X100MM (TROCAR) ×6 IMPLANT
SLEEVE GASTRECTOMY 40FR VISIGI (MISCELLANEOUS) ×3 IMPLANT
SOL ANTI FOG 6CC (MISCELLANEOUS) ×2 IMPLANT
SOLUTION ANTI FOG 6CC (MISCELLANEOUS) ×1
SPIKE FLUID TRANSFER (MISCELLANEOUS) ×3 IMPLANT
SPONGE T-LAP 18X18 ~~LOC~~+RFID (SPONGE) ×3 IMPLANT
STAPLE LINE REINFORCEMENT LAP (STAPLE) ×6 IMPLANT
STAPLER ECHELON LONG 60 440 (INSTRUMENTS) ×3 IMPLANT
STAPLER RELOAD BLUE 60MM (STAPLE) ×12
STAPLER RELOAD GOLD 60MM (STAPLE) ×6
STAPLER RELOAD GREEN 60MM (STAPLE)
STRIP CLOSURE SKIN 1/2X4 (GAUZE/BANDAGES/DRESSINGS) ×3 IMPLANT
SUT MNCRL AB 4-0 PS2 18 (SUTURE) ×3 IMPLANT
SUT SURGIDAC NAB ES-9 0 48 120 (SUTURE) IMPLANT
SUT VICRYL 0 TIES 12 18 (SUTURE) ×3 IMPLANT
SYR 10ML ECCENTRIC (SYRINGE) ×3 IMPLANT
SYR 20ML LL LF (SYRINGE) ×3 IMPLANT
SYR 50ML LL SCALE MARK (SYRINGE) ×3 IMPLANT
TOWEL OR 17X26 10 PK STRL BLUE (TOWEL DISPOSABLE) ×3 IMPLANT
TOWEL OR NON WOVEN STRL DISP B (DISPOSABLE) ×3 IMPLANT
TROCAR ADV FIXATION 5X100MM (TROCAR) ×3 IMPLANT
TROCAR BLADELESS 15MM (ENDOMECHANICALS) ×3 IMPLANT
TROCAR BLADELESS OPT 5 100 (ENDOMECHANICALS) ×3 IMPLANT
TUBING CONNECTING 10 (TUBING) ×3 IMPLANT
TUBING ENDO SMARTCAP (MISCELLANEOUS) ×3 IMPLANT

## 2021-08-05 NOTE — Progress Notes (Signed)
PHARMACY CONSULT FOR:  Risk Assessment for Post-Discharge VTE Following Bariatric Surgery ? ?Post-Discharge VTE Risk Assessment: ?This patient's probability of 30-day post-discharge VTE is increased due to the factors marked: ? Sleeve gastrectomy  ? Liver disorder (transplant, cirrhosis, or nonalcoholic steatohepatitis)  ? Hx of VTE  ? Hemorrhage requiring transfusion  ? GI perforation, leak, or obstruction  ? ====================================================  ?  Female  ?  Age >/=60 years  ?  BMI >/=50 kg/m2  ?  CHF  ?  Dyspnea at Rest  ?  Paraplegia  ?  Non-gastric-band surgery  ?  Operation Time >/=3 hr  ?  Return to OR   ?  Length of Stay >/= 3 d  ? Hypercoagulable condition  ? Significant venous stasis  ? ? ? ? ?Predicted probability of 30-day post-discharge VTE: 0.52% ? ?Other patient-specific factors to consider: NA ? ? ?Recommendation for Discharge: ?Enoxaparin 60 mg Napavine q12h x 2 weeks post-discharge ? ? ? ?Kelli Simmons is a 61 y.o. female who underwent laparoscopic sleeve gastrectomy on 08/05/21 ?  ?Case start: 1244 ?Case end: 1350 ? ? ?No Known Allergies ? ?Patient Measurements: ?Height: 5\' 2"  (157.5 cm) ?Weight: (!) 142 kg (313 lb) ?IBW/kg (Calculated) : 50.1 ?Body mass index is 57.25 kg/m?. ? ?No results for input(s): WBC, HGB, HCT, PLT, APTT, CREATININE, LABCREA, CREATININE, CREAT24HRUR, MG, PHOS, ALBUMIN, PROT, ALBUMIN, AST, ALT, ALKPHOS, BILITOT, BILIDIR, IBILI in the last 72 hours. ?Estimated Creatinine Clearance: 102.6 mL/min (by C-G formula based on SCr of 0.78 mg/dL). ? ? ? ?Past Medical History:  ?Diagnosis Date  ? Ankylosing spondylitis ([place])   ? spine  ? Back pain   ? BMI 50.0-59.9, adult ([place])   ? Endometrial thickening on ultrasound   ? GERD (gastroesophageal reflux disease)   ? Hypertension   ? Osteoarthritis   ? knees  ? ? ? ?Medications Prior to Admission  ?Medication Sig Dispense Refill Last Dose  ? acetaminophen (TYLENOL) 500 MG tablet Take 1,000 mg by mouth every 6 (six) hours as needed  (pain).   08/04/2021 at 1100  ? amLODipine (NORVASC) 5 MG tablet Take 5 mg by mouth every evening.   08/04/2021 at 2359  ? calcium carbonate (OS-CAL) 1250 (500 Ca) MG chewable tablet Chew 1 tablet by mouth daily.   08/03/2021  ? Cholecalciferol (VITAMIN D3) 125 MCG (5000 UT) CHEW Chew 5,000 Units by mouth daily.   08/04/2021 at 1100  ? DULoxetine (CYMBALTA) 60 MG capsule Take 60 mg by mouth in the morning.   08/05/2021 at 1000  ? fluticasone (FLONASE) 50 MCG/ACT nasal spray Place 2 sprays into both nostrils in the morning.   08/04/2021 at 1100  ? loratadine (CLARITIN) 10 MG tablet Take 10 mg by mouth in the morning.   08/04/2021 at 2359  ? Melatonin 10 MG TABS Take by mouth. chewable   08/04/2021 at 2359  ? Multiple Vitamin (MULTIVITAMIN WITH MINERALS) TABS tablet Take 1 tablet by mouth in the morning.   08/03/2021  ? Multiple Vitamins-Minerals (EMERGEN-C IMMUNE) PACK Take 1 Package by mouth daily.   08/03/2021  ? omeprazole (PRILOSEC) 20 MG capsule Take 1 capsule (20 mg total) by mouth in the morning. 90 capsule 0 08/05/2021 at 1000  ? Probiotic Product (ALIGN) CHEW Chew 1 tablet by mouth daily.   08/03/2021  ? ? ? ?Tawnya Crook, PharmD, BCPS ?Clinical Pharmacist ?08/05/2021 3:24 PM ? ? ?

## 2021-08-05 NOTE — Progress Notes (Signed)
Patient seen in PACU prior to surgery.  Discussed QI "Goals for Discharge" document with patient including ambulation in halls, Incentive Spirometry use every hour, and oral care.  Also discussed pain and nausea control.  BSTOP education provided including BSTOP information guide, "Guide for Pain Management after your Bariatric Procedure".  Diet progression education provided including "Bariatric Surgery Post-Op Food Plan Phase 1: Liquids".  Questions answered.  Will continue to partner with bedside RN and follow up with patient per protocol after surgery complete.  ?

## 2021-08-05 NOTE — Op Note (Signed)
? ?  Patient: Kelli Simmons (1961-04-01, [id number]) ? ?Date of Surgery: 08/05/2021  ? ?Preoperative Diagnosis: MORBID OBESITY, HYPERTENSION, GERD, OSTEOARTHRITIS  ? ?Postoperative Diagnosis: MORBID OBESITY, HYPERTENSION, GERD, OSTEOARTHRITIS  ? ?Surgical Procedure: Upper Endoscopy  ? ?Surgeon: Ivar Drape, MD ? ?Anesthesiologist: Elmer Picker, MD ?CRNA: Doran Clay, CRNA; Elisabeth Cara, CRNA  ? ?Anesthesia: General  ? ?Fluids:  ?Total I/O ?In: 1100 [I.V.:1000; IV Piggyback:100] ?Out: 15 [Blood:15] ? ?Complications: None ? ?Drains:  None ? ?Specimen: None ? ? ?Indications for Procedure: Kelli Simmons is a 61 y.o. female undergoing laparoscopic sleeve gastrectomy and an EGD was requested to evaluate foregut anatomy intraoperatively. ? ?Description of Procedure: During the procedure, I scrubbed out and obtained the Olympus endoscope. I gently placed endoscope in the patient's oropharynx and gently glided it down the esophagus without any difficulty under direct visualization.  The scope was advanced as far as the pylorus and then slowly withdrawn to inspect the foregut anatomy.  Dr. Fredricka Bonine had placed saline in the upper abdomen and all staple lines were submerged to ensure no air leak. There was no evidence of bubbles. There was no evidence of intraluminal bleeding and the mucosa appeared healthy.  The lumen was widely patent without evidence of stricture.  The intraluminal insufflation was decompressed. The scope was withdrawn. The patient tolerated this portion of the procedure well. Please see Dr Derrill Memo operative note for details regarding the remainder of the procedure.  ? ? ?Ivar Drape, MD ?General, Bariatric, & Minimally Invasive Surgery ?[facility], [place] ? ?

## 2021-08-05 NOTE — Op Note (Signed)
Operative Note ? ?Kelli Simmons  ?[id number]  ?[id number]  ?08/05/2021 ? ? ?Surgeon: Phylliss Blakes MD FACS ?  ?Assistant: Ivar Drape MD ?  ?Procedure performed: laparoscopic sleeve gastrectomy, upper endoscopy ?  ?Preop diagnosis: Morbid obesity Body mass index is 57.25 kg/m?Marland Kitchen ?Post-op diagnosis/intraop findings: same ?  ?Specimens: fundus ?Retained items: none  ?EBL: minimal  ?Complications: none ?  ?Description of procedure: After obtaining informed consent and administration of chemical DVT prophylaxis in holding, the patient was taken to the operating room and placed supine on operating room table where general endotracheal anesthesia was initiated, preoperative antibiotics were administered, SCDs applied, and a formal timeout was performed. The abdomen was prepped and draped in usual sterile fashion. Peritoneal access was gained using a Visiport technique in the left upper quadrant and insufflation to 15 mmHg ensued without issue. Gross inspection revealed no evidence of injury.  There are dense omental adhesions to the central anterior abdomen which are left in situ.  Under direct visualization three more 5 mm trochars were placed in the right and left hemiabdomen and the 52mm trocar in the right paramedian upper abdomen. Bilateral laparoscopic assisted TAPS blocks were performed with Exparel diluted with 0.25 percent Marcaine with epinephrine. The patient was placed in steep Trendelenburg and the liver retractor was introduced through an incision in the upper midline and secured to the post externally to maintain the left lobe retracted anteriorly.  ?There was no hiatal hernia on direct inspection of the anterior hiatus.  The posterior crura were dissected additionally and there was no hiatal hernia.  ?Using the Harmonic scalpel, the greater curvature of the stomach was dissected away from the greater omentum and short gastric vessels were divided. This began 6 cm from the pylorus, and dissection  proceeded until the left crus was clearly exposed. The 65 [place] VisiGi was then introduced and directed down towards the pylorus. This was placed to suction against the lesser curve. Serial fires of the linear cutting stapler with seam guards were then employed to create our sleeve. The first fire used a gold load and ensured adequate room at the angularis incisura. Then several blue loads were then employed to create a narrow tubular stomach preserving approximately 1 cm lateral to the angle of His. The excised stomach was then removed through our 15 mm trocar site within an Endo Catch bag.  ?The visigi was taken off of suction and a few puffs of air were introduced, inflating the sleeve. No bubbles were observed in the irrigation fluid around the stomach and the shape was noted to be evenly tubular without any narrowing at the angularis. The visigi was then removed. Upper endoscopy was performed by the assistant surgeon and the sleeve was noted to be airtight, the staple line was hemostatic. Please see his separate note. The endoscope was removed. The 15 mm trocar site fascia in the right upper abdomen was closed with a 0 Vicryl using the laparoscopic suture passer under direct visualization. The liver retractor was removed under direct visualization. The abdomen was then desufflated and all remaining trochars removed. The skin incisions were closed with subcuticular Monocryl; benzoin, Steri-Strips and Band-Aids were applied The patient was then awakened, extubated and taken to PACU in stable condition.   ?  ?All counts were correct at the completion of the case.  ?   ?

## 2021-08-05 NOTE — Discharge Instructions (Signed)
GASTRIC BYPASS / SLEEVE  ?Home Care Instructions ? ?These instructions are to help you care for yourself when you go home. ? ?Call: If you have any problems. ?Call [phone] and ask for the surgeon on call ?If you have an emergency related to your surgery please use the ER at [facility].  ?Tell the ER staff that you are a new post-op gastric bypass or gastric sleeve patient ?  ?Signs and symptoms to report: Severe vomiting or nausea ?If you cannot handle clear liquids for longer than 1 day, call your surgeon  ?Abdominal pain which does not get better after taking your pain medication ?Fever greater than 100.4? F and chills ?Heart rate over 100 beats a minute ?Trouble breathing ?Chest pain ? Redness, swelling, drainage, or foul odor at incision (surgical) sites ? If your incisions open or pull apart ?Swelling or pain in calf (lower leg) ?Diarrhea (Loose bowel movements that happen often), frequent watery, uncontrolled bowel movements ?Constipation, (no bowel movements for 3 days) if this happens:  ?Take Milk of Magnesia, 2 tablespoons by mouth, 3 times a day for 2 days if needed ?Stop taking Milk of Magnesia once you have had a bowel movement ?Call your doctor if constipation continues ?Or ?Take Miralax  (instead of Milk of Magnesia) following the label instructions ?Stop taking Miralax once you have had a bowel movement ?Call your doctor if constipation continues ?Anything you think is ?abnormal for you? ?  ?Normal side effects after surgery: Unable to sleep at night or unable to concentrate ?Irritability ?Being tearful (crying) or depressed ?These are common complaints, possibly related to your anesthesia, stress of surgery and change in lifestyle, that usually go away a few weeks after surgery.  If these feelings continue, call your medical doctor.  ?Wound Care: You may have surgical glue, steri-strips, or staples over your incisions after surgery ?Surgical glue:  Looks like a clear film over your incisions  and will wear off a little at a time ?Steri-strips : Adhesive strips of tape over your incisions. You may notice a yellowish color on the skin under the steri-strips. This is used to make the   steri-strips stick better. Do not pull the steri-strips off - let them fall off ?Staples: Staples may be removed before you leave the hospital ?If you go home with staples, call [facility] at for an appointment with your surgeon?s nurse to have staples removed 10 days after surgery, [phone] ?Showering: You may shower two (2) days after your surgery unless your surgeon tells you differently ?Wash gently around incisions with warm soapy water, rinse well, and gently pat dry  ?If you have a drain (tube from your incision), you may need someone to hold this while you shower  ?No tub baths until staples are removed and incisions are healed   ?  ?Medications: Medications should be liquid or crushed if larger than the size of a dime ?Extended release pills (medication that releases a little bit at a time through the day) should not be crushed ?Depending on the size and number of medications you take, you may need to space (take a few throughout the day)/change the time you take your medications so that you do not over-fill your pouch (smaller stomach) ?Make sure you follow-up with your primary care physician to make medication changes needed during rapid weight loss and life-style changes ?If you have diabetes, follow up with the doctor that orders your diabetes medication(s) within one week after surgery and check   your blood sugar regularly. ?Do not drive while taking narcotics (pain medications) ?DO NOT take NSAID'S (Examples of NSAID's include ibuprofen, naproxen)  ?Diet:                    First 2 Weeks ? You will see the nutritionist about two (2) weeks after your surgery. The nutritionist will increase the types of foods you can eat if you are handling liquids well: ?If you have severe vomiting or nausea  and cannot handle clear liquids lasting longer than 1 day, call your surgeon  ?Protein Shake ?Drink at least 2 ounces of shake 5-6 times per day ?Each serving of protein shakes (usually 8 - 12 ounces) should have a minimum of:  ?15 grams of protein  ?And no more than 5 grams of carbohydrate  ?Goal for protein each day: ?Men = 80 grams per day ?Women = 60 grams per day ?Protein powder may be added to fluids such as non-fat milk or Lactaid milk or Soy milk (limit to 35 grams added protein powder per serving) ? ?Hydration ?Slowly increase the amount of water and other clear liquids as tolerated (See Acceptable Fluids) ?Slowly increase the amount of protein shake as tolerated  ? Sip fluids slowly and throughout the day ?May use sugar substitutes in small amounts (no more than 6 - 8 packets per day; i.e. Splenda) ? ?Fluid Goal ?The first goal is to drink at least 8 ounces of protein shake/drink per day (or as directed by the nutritionist);  See handout from pre-op Bariatric Education Class for examples of protein shake/drink.   ?Slowly increase the amount of protein shake you drink as tolerated ?You may find it easier to slowly sip shakes throughout the day ?It is important to get your proteins in first ?Your fluid goal is to drink 64 - 100 ounces of fluid daily ?It may take a few weeks to build up to this ?32 oz (or more) should be clear liquids  ?And  ?32 oz (or more) should be full liquids (see below for examples) ?Liquids should not contain sugar, caffeine, or carbonation ? ?Clear Liquids: ?Water or Sugar-free flavored water (i.e. Fruit H2O, Propel) ?Decaffeinated coffee or tea (sugar-free) ?Victorious Cosio Lite, Wyler?s Lite, Minute Maid Lite ?Sugar-free Jell-O ?Bouillon or broth ?Sugar-free Popsicle:   *Less than 20 calories each; Limit 1 per day ? ?Full Liquids: ?Protein Shakes/Drinks + 2 choices per day of other full liquids ?Full liquids must be: ?No More Than 12 grams of Carbs per serving  ?No More Than 3 grams of Fat  per serving ?Strained low-fat cream soup ?Non-Fat milk ?Fat-free Lactaid Milk ?Sugar-free yogurt (Dannon Lite & Fit, Greek yogurt) ? ? ? ?  ?Vitamins and Minerals Start 1 day after surgery unless otherwise directed by your surgeon ?Bariatric Specific Complete Multivitamins ?Chewable Calcium Citrate with Vitamin D-3 ?(Example: 3 Chewable Calcium Plus 600 with Vitamin D-3) ?Take 500 mg three (3) times a day for a total of 1500 mg each day ?Do not take all 3 doses of calcium at one time as it may cause constipation, and you can only absorb 500 mg  at a time  ?Do not mix multivitamins containing iron with calcium supplements; take 2 hours apart ? ?Menstruating women and those at risk for anemia (a blood disease that causes weakness) may need extra iron ?Talk with your doctor to see if you need more iron ?If you need extra iron: Total daily Iron recommendation (including Vitamins) is 50 to 100   mg Iron/day ?Do not stop taking or change any vitamins or minerals until you talk to your nutritionist or surgeon ?Your nutritionist and/or surgeon must approve all vitamin and mineral supplements ?  ?Activity and Exercise: It is important to continue walking at home.  Limit your physical activity as instructed by your doctor.  During this time, use these guidelines: ?Do not lift anything greater than ten (10) pounds for at least two (2) weeks ?Do not go back to work or drive until your surgeon says you can ?You may have sex when you feel comfortable  ?It is VERY important for female patients to use a reliable birth control method; fertility often increases after surgery  ?Do not get pregnant for at least 18 months ?Start exercising as soon as your doctor tells you that you can ?Make sure your doctor approves any physical activity ?Start with a simple walking program ?Walk 5-15 minutes each day, 7 days per week.  ?Slowly increase until you are walking 30-45 minutes per day ?Consider joining our BELT program. [phone] or email  [email or web address] ?  ?Special Instructions Things to remember: ? ?Use your CPAP when sleeping if this applies to you, do not stop the use of CPAP unless directed by physician after a sleep study ?[place]

## 2021-08-05 NOTE — Anesthesia Postprocedure Evaluation (Signed)
Anesthesia Post Note ? ?Patient: Kelli Simmons ? ?Procedure(s) Performed: LAPAROSCOPIC GASTRIC SLEEVE RESECTION (Abdomen) ?UPPER GI ENDOSCOPY (Esophagus) ? ?  ? ?Patient location during evaluation: PACU ?Anesthesia Type: General ?Level of consciousness: awake and alert ?Pain management: pain level controlled ?Vital Signs Assessment: post-procedure vital signs reviewed and stable ?Respiratory status: spontaneous breathing, nonlabored ventilation, respiratory function stable and patient connected to nasal cannula oxygen ?Cardiovascular status: blood pressure returned to baseline and stable ?Postop Assessment: no apparent nausea or vomiting ?Anesthetic complications: no ? ? ?No notable events documented. ? ?Last Vitals:  ?Vitals:  ? 08/05/21 1515 08/05/21 1615  ?BP: (!) 144/71 (!) 145/70  ?Pulse: 80 78  ?Resp: 18 15  ?Temp:  36.5 ?C  ?SpO2: 94% 100%  ?  ?Last Pain:  ?Vitals:  ? 08/05/21 1615  ?TempSrc: Oral  ?PainSc:   ? ? ?  ?  ?  ?  ?  ?  ? ?Jordin Vicencio L Errol Ala ? ? ? ? ?

## 2021-08-05 NOTE — Interval H&P Note (Signed)
History and Physical Interval Note: ? ?08/05/2021 ?11:43 AM ? ?Annalissa Marandola  has presented today for surgery, with the diagnosis of MORBID OBESITY, HYPERTENSION, GERD, OSTEOARTHRITIS.  The various methods of treatment have been discussed with the patient and family. After consideration of risks, benefits and other options for treatment, the patient has consented to  Procedure(s): ?LAPAROSCOPIC GASTRIC SLEEVE RESECTION (N/A) ?UPPER GI ENDOSCOPY (N/A) as a surgical intervention.  The patient's history has been reviewed, patient examined, no change in status, stable for surgery.  I have reviewed the patient's chart and labs.  Questions were answered to the patient's satisfaction.   ? ? ?Aairah Negrette Rich Brave ? ? ?

## 2021-08-05 NOTE — Transfer of Care (Signed)
Immediate Anesthesia Transfer of Care Note ? ?Patient: Kelli Simmons ? ?Procedure(s) Performed: LAPAROSCOPIC GASTRIC SLEEVE RESECTION (Abdomen) ?UPPER GI ENDOSCOPY (Esophagus) ? ?Patient Location: PACU ? ?Anesthesia Type:General ? ?Level of Consciousness: awake, alert , oriented and patient cooperative ? ?Airway & Oxygen Therapy: Patient Spontanous Breathing and Patient connected to face mask oxygen ? ?Post-op Assessment: Report given to RN, Post -op Vital signs reviewed and stable and Patient moving all extremities ? ?Post vital signs: Reviewed and stable ? ?Last Vitals:  ?Vitals Value Taken Time  ?BP 135/68 08/05/21 1400  ?Temp    ?Pulse 75 08/05/21 1402  ?Resp 17 08/05/21 1402  ?SpO2 100 % 08/05/21 1402  ?Vitals shown include unvalidated device data. ? ?Last Pain:  ?Vitals:  ? 08/05/21 1128  ?TempSrc:   ?PainSc: 7   ?   ? ?  ? ?Complications: No notable events documented. ?

## 2021-08-05 NOTE — Anesthesia Preprocedure Evaluation (Addendum)
Anesthesia Evaluation  ?Patient identified by MRN, date of birth, ID band ?Patient awake ? ? ? ?Reviewed: ?Allergy & Precautions, NPO status , Patient's Chart, lab work & pertinent test results ? ?Airway ?Mallampati: I ? ?TM Distance: >3 FB ?Neck ROM: Full ? ? ? Dental ?no notable dental hx. ?(+) Teeth Intact, Dental Advisory Given ?  ?Pulmonary ?neg pulmonary ROS, former smoker,  ?  ?Pulmonary exam normal ?breath sounds clear to auscultation ? ? ? ? ? ? Cardiovascular ?hypertension, Pt. on medications ?Normal cardiovascular exam ?Rhythm:Regular Rate:Normal ? ? ?  ?Neuro/Psych ?PSYCHIATRIC DISORDERS Anxiety negative neurological ROS ?   ? GI/Hepatic ?Neg liver ROS, GERD  Medicated,  ?Endo/Other  ?Morbid obesity (BMI 57) ? Renal/GU ?negative Renal ROS  ?negative genitourinary ?  ?Musculoskeletal ? ?(+) Arthritis ,  ? Abdominal ?  ?Peds ? Hematology ?negative hematology ROS ?(+)   ?Anesthesia Other Findings ? ? Reproductive/Obstetrics ? ?  ? ? ? ? ? ? ? ? ? ? ? ? ? ?  ?  ? ? ? ? ? ? ? ?Anesthesia Physical ?Anesthesia Plan ? ?ASA: 3 ? ?Anesthesia Plan: General  ? ?Post-op Pain Management: Tylenol PO (pre-op)* and Ketamine IV*  ? ?Induction: Intravenous ? ?PONV Risk Score and Plan: 3 and Midazolam, Dexamethasone and Ondansetron ? ?Airway Management Planned: Oral ETT ? ?Additional Equipment:  ? ?Intra-op Plan:  ? ?Post-operative Plan: Extubation in OR ? ?Informed Consent: I have reviewed the patients History and Physical, chart, labs and discussed the procedure including the risks, benefits and alternatives for the proposed anesthesia with the patient or authorized representative who has indicated his/her understanding and acceptance.  ? ? ? ?Dental advisory given ? ?Plan Discussed with: CRNA ? ?Anesthesia Plan Comments:   ? ? ? ? ? ? ?Anesthesia Quick Evaluation ? ?

## 2021-08-05 NOTE — Anesthesia Procedure Notes (Signed)
Procedure Name: Intubation ?Date/Time: 08/05/2021 12:27 PM ?Performed by: Victoriano Lain, CRNA ?Pre-anesthesia Checklist: Patient identified, Emergency Drugs available, Suction available, Patient being monitored and Timeout performed ?Patient Re-evaluated:Patient Re-evaluated prior to induction ?Oxygen Delivery Method: Circle system utilized ?Preoxygenation: Pre-oxygenation with 100% oxygen ?Induction Type: IV induction ?Ventilation: Mask ventilation without difficulty ?Laryngoscope Size: Mac and 4 ?Grade View: Grade I ?Tube type: Oral ?Tube size: 7.0 mm ?Number of attempts: 1 ?Airway Equipment and Method: Stylet ?Placement Confirmation: ETT inserted through vocal cords under direct vision, positive ETCO2 and breath sounds checked- equal and bilateral ?Secured at: 21 cm ?Tube secured with: Tape ?Dental Injury: Teeth and Oropharynx as per pre-operative assessment  ? ? ? ? ?

## 2021-08-06 ENCOUNTER — Encounter (HOSPITAL_COMMUNITY): Payer: Self-pay | Admitting: Surgery

## 2021-08-06 LAB — CBC WITH DIFFERENTIAL/PLATELET
Abs Immature Granulocytes: 0.04 10*3/uL (ref 0.00–0.07)
Basophils Absolute: 0 10*3/uL (ref 0.0–0.1)
Basophils Relative: 0 %
Eosinophils Absolute: 0 10*3/uL (ref 0.0–0.5)
Eosinophils Relative: 0 %
HCT: 40.4 % (ref 36.0–46.0)
Hemoglobin: 13.1 g/dL (ref 12.0–15.0)
Immature Granulocytes: 1 %
Lymphocytes Relative: 9 %
Lymphs Abs: 0.8 10*3/uL (ref 0.7–4.0)
MCH: 31 pg (ref 26.0–34.0)
MCHC: 32.4 g/dL (ref 30.0–36.0)
MCV: 95.5 fL (ref 80.0–100.0)
Monocytes Absolute: 0 10*3/uL — ABNORMAL LOW (ref 0.1–1.0)
Monocytes Relative: 1 %
Neutro Abs: 7.9 10*3/uL — ABNORMAL HIGH (ref 1.7–7.7)
Neutrophils Relative %: 89 %
Platelets: 400 10*3/uL (ref 150–400)
RBC: 4.23 MIL/uL (ref 3.87–5.11)
RDW: 13.2 % (ref 11.5–15.5)
WBC: 8.8 10*3/uL (ref 4.0–10.5)
nRBC: 0 % (ref 0.0–0.2)

## 2021-08-06 LAB — COMPREHENSIVE METABOLIC PANEL
ALT: 24 U/L (ref 0–44)
AST: 31 U/L (ref 15–41)
Albumin: 3.8 g/dL (ref 3.5–5.0)
Alkaline Phosphatase: 83 U/L (ref 38–126)
Anion gap: 9 (ref 5–15)
BUN: 7 mg/dL (ref 6–20)
CO2: 24 mmol/L (ref 22–32)
Calcium: 8.7 mg/dL — ABNORMAL LOW (ref 8.9–10.3)
Chloride: 104 mmol/L (ref 98–111)
Creatinine, Ser: 0.56 mg/dL (ref 0.44–1.00)
GFR, Estimated: >60 mL/min (ref >60)
Glucose, Bld: 134 mg/dL — ABNORMAL HIGH (ref 70–99)
Potassium: 3.4 mmol/L — ABNORMAL LOW (ref 3.5–5.1)
Sodium: 137 mmol/L (ref 135–145)
Total Bilirubin: 0.3 mg/dL (ref 0.3–1.2)
Total Protein: 7.8 g/dL (ref 6.5–8.1)

## 2021-08-06 LAB — SURGICAL PATHOLOGY

## 2021-08-06 LAB — MAGNESIUM: Magnesium: 1.9 mg/dL (ref 1.7–2.4)

## 2021-08-06 MED ORDER — ENOXAPARIN SODIUM 60 MG/0.6ML IJ SOSY: 60.0000 mg | PREFILLED_SYRINGE | Freq: Two times a day (BID) | INTRAMUSCULAR | 0 refills | Status: DC

## 2021-08-06 MED ORDER — POTASSIUM CHLORIDE 10 MEQ/100ML IV SOLN
10.0000 meq | INTRAVENOUS | Status: AC
  Administered 2021-08-06 (×3): 10 meq via INTRAVENOUS
  Filled 2021-08-06: qty 100

## 2021-08-06 MED ORDER — DOCUSATE SODIUM 100 MG PO CAPS: 100.0000 mg | ORAL_CAPSULE | Freq: Two times a day (BID) | ORAL | 0 refills | Status: AC

## 2021-08-06 MED ORDER — ONDANSETRON 4 MG PO TBDP: 4.0000 mg | ORAL_TABLET | Freq: Four times a day (QID) | ORAL | 0 refills | Status: DC | PRN

## 2021-08-06 MED ORDER — TRAMADOL HCL 50 MG PO TABS: 50.0000 mg | ORAL_TABLET | Freq: Four times a day (QID) | ORAL | 0 refills | Status: AC | PRN

## 2021-08-06 MED ORDER — PANTOPRAZOLE SODIUM 40 MG PO TBEC: 40.0000 mg | DELAYED_RELEASE_TABLET | Freq: Every day | ORAL | 0 refills | Status: AC

## 2021-08-06 MED ORDER — POTASSIUM CHLORIDE 20 MEQ PO PACK
20.0000 meq | PACK | Freq: Once | ORAL | Status: AC
Start: 2021-08-06 — End: 2021-08-06
  Administered 2021-08-06: 20 meq via ORAL
  Filled 2021-08-06: qty 1

## 2021-08-06 MED ORDER — ACETAMINOPHEN 500 MG PO TABS: 1000.0000 mg | ORAL_TABLET | Freq: Three times a day (TID) | ORAL | 0 refills | Status: AC

## 2021-08-06 NOTE — Progress Notes (Signed)
Patient alert and oriented, Post op day 1.  Provided support and encouragement.  Encouraged pulmonary toilet, ambulation and small sips of liquids.  All questions answered.  Will continue to monitor. 

## 2021-08-06 NOTE — Progress Notes (Signed)
S: Uneventful night, but was very groggy. Tolerating liquids without issue. No pain or nausea.  ? ?O: ?Vitals, labs, intake/output, and orders reviewed at this time. Afebrile, HR 74-100, normo- to hpertensive, 98% RA. PO 240, UOP 1300 + 1x. BMP- hypokalemia 3.4, otherwise unremarkable. CBC unremarkable.  ?Rec'd tramadol x 1 at 5pm, no other PRNs ? ?Gen: A&Ox3, no distress  ?H&N: EOMI, atraumatic, neck supple ?Chest: unlabored respirations, RRR ?Abd: soft, nontender, nondistended, incision(s) c/d/i without cellulitis or hematoma ?Ext: warm, no edema ?Neuro: grossly normal ? ?Lines/tubes/drains: PIV ? ?A/P: POD 1 s/p laparoscopic sleeve gastrectomy ?-Continue clear liquids/ protein shakes ?-Ambulate, SCDs while in bed, prophylactic lovenox, pulmonary toilet ?-stop gabapentin given somnolence ?-Replace potassium PO and IV ?-Possible discharge this afternoon pending continued good clinical progress ? ? ?Phylliss Blakes, MD FACS ?[facility] ? ?  ?

## 2021-08-06 NOTE — TOC Transition Note (Signed)
Transition of Care (TOC) - CM/SW Discharge Note ? ?Patient Details  ?Name: Kelli Simmons ?MRN: [id number] ?Date of Birth: 09-29-1960 ? ?Transition of Care (TOC) CM/SW Contact:  ?Sherie Don, LCSW ?Phone Number: ?08/06/2021, 11:07 AM ? ?Clinical Narrative: TOC consulted for benefits check for enoxaparin 60 mg [place] q12h x 2 weeks post-discharge, which will be a $15.00 copay. CSW provided copay information to patient. TOC signing off. ? ?Final next level of care: Home/Self Care ?Barriers to Discharge: No Barriers Identified ? ?Patient Goals and CMS Choice ?Patient states their goals for this hospitalization and ongoing recovery are:: Discharge home ?Choice offered to / list presented to : NA ? ?Discharge Plan and Services        ?DME Arranged: N/A ?DME Agency: NA ? ?Readmission Risk Interventions ?   ? View : No data to display.  ?  ?  ?  ? ?

## 2021-08-06 NOTE — TOC Benefit Eligibility Note (Signed)
Transition of Care (TOC) Benefit Eligibility Note  ? ? ?Patient Details  ?Name: Kelli Simmons ?MRN: [id number] ?Date of Birth: 1960-09-25 ? ? ?Medication/Dose: Enoxaparin 60 mg [place] q12h x 2 weeks post-discharge ? ?Covered?: Yes ? ?Tier: Other ? ?Prescription Coverage Preferred Pharmacy: local ? ?Spoke with Person/Company/Phone Number:: Brenda/ [facility] [employer] [phone] ? ?Co-Pay: $15.00 ? ?Prior Approval: No ? ?Deductible: Met ? ?  ? ? ? ?Kerin Salen ?Phone Number: ?08/06/2021, 9:19 AM ? ? ? ? ?

## 2021-08-06 NOTE — Progress Notes (Signed)
Patient unable to ambulate in the hallway due to lethargy.  ?

## 2021-08-06 NOTE — Plan of Care (Signed)
  Problem: Activity: Goal: Risk for activity intolerance will decrease Outcome: Progressing   Problem: Nutrition: Goal: Adequate nutrition will be maintained Outcome: Progressing   Problem: Safety: Goal: Ability to remain free from injury will improve Outcome: Progressing   Problem: Pain Managment: Goal: General experience of comfort will improve Outcome: Progressing   

## 2021-08-06 NOTE — Progress Notes (Signed)
Patient alert and oriented, pain is controlled. Patient is tolerating fluids, advanced to protein shake today, patient is tolerating well.  Reviewed Gastric sleeve discharge instructions with patient and patient is able to articulate understanding.  Provided information on BELT program, Support Group and [facility] outpatient pharmacy. All questions answered, will continue to monitor.  ? ?Reviewed Lovenox teaching kit and pt reviewed Lovenox "Patient-Self Injection Video".   ?

## 2021-08-06 NOTE — Discharge Summary (Signed)
Physician Discharge Summary  ?Kelli Simmons TSV:[id number] DOB: 04/21/1961 DOA: 08/05/2021 ? ?PCP: London Pepper, MD ? ?Admit date: 08/05/2021 ?Discharge date: 08/06/2021 ? ?Recommendations for Outpatient Follow-up:  ? ? Follow-up Information   ? ? Clovis Riley, MD. Daphane Shepherd on 08/28/2021.   ?Specialty: General Surgery ?Why: at 9:20am.  Please arrive 15 minutes prior to your appointment time.  Thank you. ?Contact information: ?[address] ?[address] ?[place] [address] ?[phone] ? ? ?  ?  ? ? Clovis Riley, MD. Daphane Shepherd on 09/26/2021.   ?Specialty: General Surgery ?Why: at 4:10pm.  Please arrive 15 minutes prior to your appointment time.  Thank you. ?Contact information: ?[address] ?[address] ?[place] [address] ?[phone] ? ? ?  ?  ? ?  ?  ? ?  ? ?Discharge Diagnoses:  ?Principal Problem: ?  Morbid obesity ([place]) ? ? ?Surgical Procedure: Laparoscopic Sleeve Gastrectomy, upper endoscopy ? ?Discharge Condition: Good ?Disposition: Home ? ?Diet recommendation: Postoperative sleeve gastrectomy diet (liquids only) ? ?Filed Weights  ? 08/05/21 1100  ?Weight: (!) 142 kg  ? ? ? ?Hospital Course:  ?The patient was admitted for a planned laparoscopic sleeve gastrectomy. Please see operative note. Preoperatively the patient was given 5000 units of subcutaneous heparin for DVT prophylaxis. Postoperative prophylactic Lovenox dosing was started on the evening of postoperative day 0. ERAS protocol was used. On the evening of postoperative day 0, the patient was started on water and ice chips. On postoperative day 1 the patient had no fever or tachycardia and was tolerating water in their diet was gradually advanced throughout the day. The patient was ambulating without difficulty. Their vital signs are stable without fever or tachycardia. Their hemoglobin had remained stable. The patient had received discharge instructions and counseling. They were deemed stable for discharge and had met discharge  criteria ? ? ?Discharge Instructions ? ? ?Allergies as of 08/06/2021   ?No Known Allergies ?  ? ?  ?Medication List  ?  ? ?STOP taking these medications   ? ?omeprazole 20 MG capsule ?Commonly known as: PRILOSEC ?  ? ?  ? ?TAKE these medications   ? ?acetaminophen 500 MG tablet ?Commonly known as: TYLENOL ?Take 2 tablets (1,000 mg total) by mouth every 8 (eight) hours for 5 days. ?What changed:  ?when to take this ?reasons to take this ?  ?Align Chew ?Chew 1 tablet by mouth daily. ?  ?amLODipine 5 MG tablet ?Commonly known as: NORVASC ?Take 5 mg by mouth every evening. ?Notes to patient: Monitor Blood Pressure Daily and keep a log for primary care physician.  You may need to make changes to your medications with rapid weight loss.   ?  ?calcium carbonate 1250 (500 Ca) MG chewable tablet ?Commonly known as: OS-CAL ?Chew 1 tablet by mouth daily. ?  ?docusate sodium 100 MG capsule ?Commonly known as: Colace ?Take 1 capsule (100 mg total) by mouth 2 (two) times daily. Okay to decrease to once daily or stop taking if having loose bowel movements ?  ?DULoxetine 60 MG capsule ?Commonly known as: CYMBALTA ?Take 60 mg by mouth in the morning. ?  ?Emergen-C Immune Pack ?Take 1 Package by mouth daily. ?  ?enoxaparin 60 MG/0.6ML injection ?Commonly known as: LOVENOX ?Inject 0.6 mLs (60 mg total) into the skin 2 (two) times daily for 14 days. ?  ?fluticasone 50 MCG/ACT nasal spray ?Commonly known as: FLONASE ?Place 2 sprays into both nostrils in the morning. ?  ?loratadine 10 MG tablet ?  Commonly known as: CLARITIN ?Take 10 mg by mouth in the morning. ?  ?Melatonin 10 MG Tabs ?Take by mouth. chewable ?  ?multivitamin with minerals Tabs tablet ?Take 1 tablet by mouth in the morning. ?  ?ondansetron 4 MG disintegrating tablet ?Commonly known as: ZOFRAN-ODT ?Take 1 tablet (4 mg total) by mouth every 6 (six) hours as needed for nausea or vomiting. ?  ?pantoprazole 40 MG tablet ?Commonly known as: PROTONIX ?Take 1 tablet (40 mg total)  by mouth daily. Take this medication daily, regardless of reflux symptoms ?  ?traMADol 50 MG tablet ?Commonly known as: ULTRAM ?Take 1 tablet (50 mg total) by mouth every 6 (six) hours as needed (pain). ?  ?Vitamin D3 125 MCG (5000 UT) Chew ?Chew 5,000 Units by mouth daily. ?  ? ?  ? ? Follow-up Information   ? ? Clovis Riley, MD. Daphane Shepherd on 08/28/2021.   ?Specialty: General Surgery ?Why: at 9:20am.  Please arrive 15 minutes prior to your appointment time.  Thank you. ?Contact information: ?[address] ?[address] ?[place] [address] ?[phone] ? ? ?  ?  ? ? Clovis Riley, MD. Daphane Shepherd on 09/26/2021.   ?Specialty: General Surgery ?Why: at 4:10pm.  Please arrive 15 minutes prior to your appointment time.  Thank you. ?Contact information: ?[address] ?[address] ?[place] [address] ?[phone] ? ? ?  ?  ? ?  ?  ? ?  ? ? ? ?The results of significant diagnostics from this hospitalization (including imaging, microbiology, ancillary and laboratory) are listed below for reference.   ? ?Significant Diagnostic Studies: ?CT ABDOMEN PELVIS W CONTRAST ? ?Result Date: 07/15/2021 ?CLINICAL DATA:  6 mm calcification in the right-side of L4 on recent upper GI. Lower back pain. History of umbilical hernia repair. Cholecystectomy. C-section x2. EXAM: CT ABDOMEN AND PELVIS WITH CONTRAST TECHNIQUE: Multidetector CT imaging of the abdomen and pelvis was performed using the standard protocol following bolus administration of intravenous contrast. RADIATION DOSE REDUCTION: This exam was performed according to the departmental dose-optimization program which includes automated exposure control, adjustment of the mA and/or kV according to patient size and/or use of iterative reconstruction technique. CONTRAST:  141m OMNIPAQUE IOHEXOL 300 MG/ML  SOLN COMPARISON:  Upper GI 06/19/2021. FINDINGS: Lower chest: Clear lung bases. Normal heart size without pericardial or pleural effusion. Tiny hiatal hernia.  Hepatobiliary: Normal liver. Cholecystectomy, without biliary ductal dilatation. Pancreas: Normal, without mass or ductal dilatation. Spleen: Normal in size, without focal abnormality. Adrenals/Urinary Tract: Normal adrenal glands. Normal kidneys, without hydronephrosis. The plain film abnormality corresponds to a calcification of 7 mm on 52/2. This is positioned lateral to the ureter, including on delayed image 30/7. Normal urinary bladder. Stomach/Bowel: Normal remainder of the stomach. Normal colon, appendix, and terminal ileum. Normal small bowel. Vascular/Lymphatic: Aortic atherosclerosis. No abdominopelvic adenopathy. Reproductive: Normal uterus and adnexa. Other: No significant free fluid. Mild pelvic floor laxity. Soft tissue thickening about the midline of the pelvis is likely scarring from prior cesarean section on 55/2. Musculoskeletal: Lumbosacral spondylosis with trace L4-5 anterolisthesis. IMPRESSION: 1. The calcification on upper GI is lateral to the right ureter and is vascular, likely within a gonadal vein branch. 2.  No acute process in the abdomen or pelvis. 3.  Tiny hiatal hernia. 4.  Aortic Atherosclerosis (ICD10-I70.0). Electronically Signed   By: KAbigail MiyamotoM.D.   On: 07/15/2021 14:28   ? ?Labs: ?Basic Metabolic Panel: ?Recent Labs  ?Lab 07/31/21 ?1108 08/06/21 ?0326  ?NA 139 137  ?  K 3.6 3.4*  ?CL 105 104  ?CO2 25 24  ?GLUCOSE 109* 134*  ?BUN 11 7  ?CREATININE 0.78 0.56  ?CALCIUM 9.0 8.7*  ?MG  --  1.9  ? ?Liver Function Tests: ?Recent Labs  ?Lab 07/31/21 ?1108 08/06/21 ?0326  ?AST 30 31  ?ALT 21 24  ?ALKPHOS 86 83  ?BILITOT 0.6 0.3  ?PROT 7.5 7.8  ?ALBUMIN 3.9 3.8  ? ? ?CBC: ?Recent Labs  ?Lab 07/31/21 ?1108 08/06/21 ?0326  ?WBC 5.8 8.8  ?NEUTROABS 2.6 7.9*  ?HGB 13.1 13.1  ?HCT 41.9 40.4  ?MCV 97.2 95.5  ?PLT 439* 400  ? ? ?CBG: ?No results for input(s): GLUCAP in the last 168 hours. ? ?Principal Problem: ?  Morbid obesity ([place]) ? ? ?Signed: ? ?Clovis Riley  MD FACS ?[facility], [place] ?[phone] ?08/06/2021, 8:22 PM ? ?

## 2021-08-08 ENCOUNTER — Telehealth (HOSPITAL_COMMUNITY): Payer: Self-pay | Admitting: *Deleted

## 2021-08-08 NOTE — Telephone Encounter (Signed)
1.  Tell me about your pain and pain management? ?Pt denies any pain, just some "soreness". ? ?2.  Let's talk about fluid intake.  How much total fluid are you taking in? ?Pt states that she is getting in at least 60oz of fluid including protein shakes and bottled water. Pt instructed to assess status and suggestions daily utilizing Hydration Action Plan on discharge folder and to call [facility] if in the "red zone".  ? ?3.  How much protein have you taken in the last 2 days? ?Pt states she is meeting her goal of 60g of protein each day with the protein shakes. ? ?4.  Have you had nausea?  Tell me about when have experienced nausea and what you did to help? ?Pt denies nausea. ?  ?5.  Has the frequency or color changed with your urine? ?Pt states that she is urinating "fine" but is concerned about seeing "some blood when I wipe".  Pt denies dysuria.  Denies fever, chills, or lower back pain. Pt instructed to monitor for worsening symptoms and to contact her PCP and/or [facility] if symptoms progress. ?  ?6.  Tell me what your incisions look like? ?"Incisions look fine". Pt denies a fever, chills.  Pt states incisions are not swollen, open, or draining.  Pt encouraged to call [facility] if incisions change. ?  ?7.  Have you been passing gas? BM? ?Pt states that she has not had a BM.  Pt instructed to take either Miralax or MoM as instructed per "Gastric Bypass/Sleeve Discharge Home Care Instructions".  Pt to call surgeon's office if not able to have BM with medication. ?  ?8.  If a problem or question were to arise who would you call?  Do you know contact numbers for [facility], [facility], and [facility]? ?Pt denies dehydration symptoms.  Pt can describe s/sx of dehydration.  Pt knows to call [facility] for surgical, [facility] for nutrition, and [facility] for non-urgent questions or concerns. ?  ?9.  How has the walking going? ?Pt states she is walking around and able to be active without difficulty. ?  ?10. Are you still using your incentive spirometer?  If so, how  often? ?Pt states that she is doing the I.S. 5x every half hour.  Pt encouraged to continue to use incentive spirometer, at least 10x every hour while awake until she sees the surgeon. ? ?11.  How are your vitamins and calcium going?  How are you taking them? ?Pt states that she is taking her supplements and vitamins without difficulty. Reinforced education about taking supplements separately and at least two hours apart. ? ?Pt states that she is taking her Lovenox injections q12h without difficulty. ? ?Reminded patient that the first 30 days post-operatively are important for successful recovery.  Practice good hand hygiene, wearing a mask when appropriate (since optional in most places), and minimizing exposure to people who live outside of the home, especially if they are exhibiting any respiratory, GI, or illness-like symptoms.   ? ?

## 2021-08-18 ENCOUNTER — Encounter (INDEPENDENT_AMBULATORY_CARE_PROVIDER_SITE_OTHER): Payer: Self-pay | Admitting: Adult Health

## 2021-08-18 NOTE — Telephone Encounter (Signed)
FYI

## 2021-08-19 ENCOUNTER — Ambulatory Visit: Payer: Federal, State, Local not specified - PPO

## 2021-08-26 ENCOUNTER — Encounter: Payer: Federal, State, Local not specified - PPO | Attending: Surgery | Admitting: Skilled Nursing Facility1

## 2021-08-26 DIAGNOSIS — M549 Dorsalgia, unspecified: Secondary | ICD-10-CM | POA: Diagnosis not present

## 2021-08-26 DIAGNOSIS — R7303 Prediabetes: Secondary | ICD-10-CM | POA: Diagnosis not present

## 2021-08-26 DIAGNOSIS — I10 Essential (primary) hypertension: Secondary | ICD-10-CM | POA: Diagnosis not present

## 2021-08-26 DIAGNOSIS — Z9884 Bariatric surgery status: Secondary | ICD-10-CM | POA: Diagnosis not present

## 2021-08-28 ENCOUNTER — Encounter: Payer: Self-pay | Admitting: Skilled Nursing Facility1

## 2021-08-28 NOTE — Progress Notes (Signed)
2 Week Post-Operative Nutrition Class ?  ?Patient was seen on 08/26/2021 for Post-Operative Nutrition education at the [facility].  ?  ?Surgery date: 08/05/2021 ?Surgery type: Laparoscopic Gastric Sleeve Resection ?Start weight at [facility]: 320.5 ?Weight today: Pt arrived late for class, no weight taken ?  ?Clinical  ?Medical hx: Arthritis, GERD, HTN, osteoarthritis, dysphagia  ?Medications: see list  ?Labs: B12 1398 ?Notable signs/symptoms: N/A ?Any previous deficiencies? No ?  ?The following the learning objectives were met by the patient during this course: ?Identifies Phase 3 (Soft, High Proteins) Dietary Goals and will begin from 2 weeks post-operatively to 2 months post-operatively ?Identifies appropriate sources of fluids and proteins  ?Identifies appropriate fat sources and healthy verses unhealthy fat types   ?States protein recommendations and appropriate sources post-operatively ?Identifies the need for appropriate texture modifications, mastication, and bite sizes when consuming solids ?Identifies appropriate fat consumption and sources ?Identifies appropriate multivitamin and calcium sources post-operatively ?Describes the need for physical activity post-operatively and will follow MD recommendations ?States when to call healthcare provider regarding medication questions or post-operative complications ?  ?Handouts given during class include: ?Phase 3A: Soft, High Protein Diet Handout ?Phase 3 High Protein Meals ?Healthy Fats ?  ?Follow-Up Plan: ?Patient will follow-up at [facility] in 6 weeks for 2 month post-op nutrition visit for diet advancement per MD.  ?

## 2021-09-01 ENCOUNTER — Telehealth: Payer: Self-pay | Admitting: Skilled Nursing Facility1

## 2021-09-01 NOTE — Telephone Encounter (Signed)
RD called pt to verify fluid intake once starting soft, solid proteins 2 week post-bariatric surgery.  ? ?Daily Fluid intake: 50 oz ?Daily Protein intake: 64+ ?Bowel Habits:  constipated for a week, used Murelax  ? ?Concerns/issues:   ?

## 2021-09-08 DIAGNOSIS — M9902 Segmental and somatic dysfunction of thoracic region: Secondary | ICD-10-CM | POA: Diagnosis not present

## 2021-09-08 DIAGNOSIS — M62830 Muscle spasm of back: Secondary | ICD-10-CM | POA: Diagnosis not present

## 2021-09-08 DIAGNOSIS — M9905 Segmental and somatic dysfunction of pelvic region: Secondary | ICD-10-CM | POA: Diagnosis not present

## 2021-09-08 DIAGNOSIS — M9903 Segmental and somatic dysfunction of lumbar region: Secondary | ICD-10-CM | POA: Diagnosis not present

## 2021-09-10 ENCOUNTER — Other Ambulatory Visit (HOSPITAL_BASED_OUTPATIENT_CLINIC_OR_DEPARTMENT_OTHER): Payer: Self-pay | Admitting: Family Medicine

## 2021-09-10 DIAGNOSIS — Z1231 Encounter for screening mammogram for malignant neoplasm of breast: Secondary | ICD-10-CM

## 2021-09-16 DIAGNOSIS — M62830 Muscle spasm of back: Secondary | ICD-10-CM | POA: Diagnosis not present

## 2021-09-16 DIAGNOSIS — M9905 Segmental and somatic dysfunction of pelvic region: Secondary | ICD-10-CM | POA: Diagnosis not present

## 2021-09-16 DIAGNOSIS — M9903 Segmental and somatic dysfunction of lumbar region: Secondary | ICD-10-CM | POA: Diagnosis not present

## 2021-09-16 DIAGNOSIS — M9902 Segmental and somatic dysfunction of thoracic region: Secondary | ICD-10-CM | POA: Diagnosis not present

## 2021-09-19 ENCOUNTER — Encounter (HOSPITAL_BASED_OUTPATIENT_CLINIC_OR_DEPARTMENT_OTHER): Payer: Self-pay | Admitting: Radiology

## 2021-09-19 ENCOUNTER — Ambulatory Visit (HOSPITAL_BASED_OUTPATIENT_CLINIC_OR_DEPARTMENT_OTHER)
Admission: RE | Admit: 2021-09-19 | Discharge: 2021-09-19 | Disposition: A | Discharge status: Home / Self Care | Payer: Federal, State, Local not specified - PPO | Source: Ambulatory Visit | Attending: Family Medicine | Admitting: Family Medicine

## 2021-09-19 DIAGNOSIS — Z1231 Encounter for screening mammogram for malignant neoplasm of breast: Secondary | ICD-10-CM | POA: Insufficient documentation

## 2021-09-24 DIAGNOSIS — M9902 Segmental and somatic dysfunction of thoracic region: Secondary | ICD-10-CM | POA: Diagnosis not present

## 2021-09-24 DIAGNOSIS — M62830 Muscle spasm of back: Secondary | ICD-10-CM | POA: Diagnosis not present

## 2021-09-24 DIAGNOSIS — M9905 Segmental and somatic dysfunction of pelvic region: Secondary | ICD-10-CM | POA: Diagnosis not present

## 2021-09-24 DIAGNOSIS — M9903 Segmental and somatic dysfunction of lumbar region: Secondary | ICD-10-CM | POA: Diagnosis not present

## 2021-10-01 ENCOUNTER — Encounter: Payer: Federal, State, Local not specified - PPO | Attending: Surgery | Admitting: Dietician

## 2021-10-01 ENCOUNTER — Encounter: Payer: Self-pay | Admitting: Dietician

## 2021-10-01 DIAGNOSIS — M9902 Segmental and somatic dysfunction of thoracic region: Secondary | ICD-10-CM | POA: Diagnosis not present

## 2021-10-01 DIAGNOSIS — M62830 Muscle spasm of back: Secondary | ICD-10-CM | POA: Diagnosis not present

## 2021-10-01 DIAGNOSIS — M9905 Segmental and somatic dysfunction of pelvic region: Secondary | ICD-10-CM | POA: Diagnosis not present

## 2021-10-01 DIAGNOSIS — M9903 Segmental and somatic dysfunction of lumbar region: Secondary | ICD-10-CM | POA: Diagnosis not present

## 2021-10-01 NOTE — Progress Notes (Signed)
Bariatric Nutrition Follow-Up Visit Medical Nutrition Therapy  Appt Start Time: 2:00   End Time: 2:50  2 Months Post-Operative Sleeve Surgery Surgery Date: 08/05/2021  NUTRITION ASSESSMENT  Anthropometrics  Start weight at [facility]: 320.5 Weight today: Pt arrived late for class, no weight taken    Body Composition Scale 10/01/2021  Weight  lbs 293.1  Total Body Fat  % 51.3     Visceral Fat 26  Fat-Free Mass  % 48.6     Total Body Water  % 38.8     Muscle-Mass  lbs 28.6  BMI 53.5  Body Fat Displacement ---        Torso  lbs 93.3        Left Leg  lbs 18.6        Right Leg  lbs 18.6        Left Arm  lbs 9.3        Right Arm  lbs 9.3     Clinical  Medical hx: Arthritis, GERD, HTN, osteoarthritis, dysphagia  Medications: see list  Labs: B12 1398 Notable signs/symptoms: N/A Any previous deficiencies? No  Lifestyle & Dietary Hx Pt states she likes lifting weights, stating she always has and is working her way up to heavier weights, but need to pace herself.  Pt states her core is week, and that is also her focus right now.  Pt states she used to work out a [employer], and has lost weight in the past lifting weights. Pt states she has worked with a Clinical research associate in the past. Pt states she feels better, stating not just the weight. Pt states she likes that we don't take anything away, only adding food to the phases.  Estimated daily fluid intake: 96+ oz Estimated daily protein intake: 60 g Supplements: multi vitamin and calcium Current average weekly physical activity: recumbent bike 3 times a week, circuit weight machines and cardio 3 times a week  24-Hr Dietary Recall First Meal: water, 1/2 mug of coffee, pure protein bar and 2-3 slices of [place] bacon Snack: Second Meal: lentil soup Snack: quest protein chips/crackers  Third Meal: lentil soup or garbanzo beans Snack: quest protein crackers Beverages: water, coffee  Post-Op Goals/ Signs/ Symptoms Using straws:  no Drinking while eating: no Chewing/swallowing difficulties: no Changes in vision: no Changes to mood/headaches: no Hair loss/changes to skin/nails: no Difficulty focusing/concentrating: no Sweating: no Limb weakness: no Dizziness/lightheadedness: no Palpitations: no  Carbonated/caffeinated beverages: no N/V/D/C/Gas: no Abdominal pain: no Dumping syndrome: no    NUTRITION DIAGNOSIS  Overweight/obesity ([place]-3.3) related to past poor dietary habits and physical inactivity as evidenced by completed bariatric surgery and following dietary guidelines for continued weight loss and healthy nutrition status.     NUTRITION INTERVENTION Nutrition counseling (C-1) and education (E-2) to facilitate bariatric surgery goals, including: Diet advancement to the next phase (phase 4) now including non-starchy vegetables The importance of consuming adequate calories as well as certain nutrients daily due to the body's need for essential vitamins, minerals, and fats The importance of daily physical activity and to reach a goal of at least 150 minutes of moderate to vigorous physical activity weekly (or as directed by their physician) due to benefits such as increased musculature and improved lab values The importance of intuitive eating specifically learning hunger-satiety cues and understanding the importance of learning a new body: The importance of mindful eating to avoid grazing behaviors   Handouts Provided Include  Phase 4 Food Plan  Learning Style & Readiness for  Change Teaching method utilized: Visual & Auditory  Demonstrated degree of understanding via: Teach Back  Readiness Level: action Barriers to learning/adherence to lifestyle change: non-identified  RD's Notes for Next Visit Assess adherence to pt chosen goals   MONITORING & EVALUATION Dietary intake, weekly physical activity, body weight.  Next Steps Patient is to follow-up in September for 6 month post-op class.

## 2021-10-08 DIAGNOSIS — M9902 Segmental and somatic dysfunction of thoracic region: Secondary | ICD-10-CM | POA: Diagnosis not present

## 2021-10-08 DIAGNOSIS — M62830 Muscle spasm of back: Secondary | ICD-10-CM | POA: Diagnosis not present

## 2021-10-08 DIAGNOSIS — M9905 Segmental and somatic dysfunction of pelvic region: Secondary | ICD-10-CM | POA: Diagnosis not present

## 2021-10-08 DIAGNOSIS — M9903 Segmental and somatic dysfunction of lumbar region: Secondary | ICD-10-CM | POA: Diagnosis not present

## 2021-12-03 ENCOUNTER — Encounter (INDEPENDENT_AMBULATORY_CARE_PROVIDER_SITE_OTHER): Payer: Self-pay

## 2021-12-15 ENCOUNTER — Other Ambulatory Visit (HOSPITAL_COMMUNITY): Payer: Self-pay | Admitting: Obstetrics & Gynecology

## 2021-12-15 DIAGNOSIS — G8929 Other chronic pain: Secondary | ICD-10-CM

## 2022-01-13 ENCOUNTER — Encounter: Payer: Federal, State, Local not specified - PPO | Attending: Surgery | Admitting: Skilled Nursing Facility1

## 2022-01-13 ENCOUNTER — Encounter: Payer: Self-pay | Admitting: Skilled Nursing Facility1

## 2022-01-13 DIAGNOSIS — E669 Obesity, unspecified: Secondary | ICD-10-CM | POA: Insufficient documentation

## 2022-01-13 NOTE — Progress Notes (Signed)
Bariatric Nutrition Follow-Up Visit Medical Nutrition Therapy   Surgery Date: 08/05/2021  NUTRITION ASSESSMENT  Anthropometrics  Start weight at [facility]: 320.5 Weight today: 269.3 pounds  Body Composition Scale 10/01/2021 01/13/2022  Weight  lbs 293.1 269.3  Total Body Fat  % 51.3 49.8     Visceral Fat 26 23  Fat-Free Mass  % 48.6 50.1     Total Body Water  % 38.8 39.5     Muscle-Mass  lbs 28.6 28.2  BMI 53.5 49.2  Body Fat Displacement ---         Torso  lbs 93.3 83.3        Left Leg  lbs 18.6 16.6        Right Leg  lbs 18.6 16.6        Left Arm  lbs 9.3 8.3        Right Arm  lbs 9.3 8.3     Clinical  Medical hx: Arthritis, GERD, HTN, osteoarthritis, dysphagia  Medications: see list  Labs:  Notable signs/symptoms: N/A Any previous deficiencies? No  Lifestyle & Dietary Hx  Pt states she works out at [employer] 6 days a week mixing up cardio and weights.  Pt states she sees the benefits of working out and feels s much stronger and not in as mush pain so it keeps her motivated to be active.  Pt states her goal is to work again now that she has less pain and more energy.  Pt states she eats about 1200-1500 calories per day.   Estimated daily fluid intake: 96+ oz Estimated daily protein intake: 60 g Supplements: multi vitamin and calcium Current average weekly physical activity: 6 days a week 50-50 minutes cardio and weight training   24-Hr Dietary Recall First Meal: water, coffee + cream, pure protein bar  Snack: protein chips Second Meal: salad and beans Snack: [place] protein bar Third Meal: beans and salad and bock choy Snack: quest cheese its or sugar free popcicle Beverages: water, coffee  Post-Op Goals/ Signs/ Symptoms Using straws: no Drinking while eating: no Chewing/swallowing difficulties: no Changes in vision: no Changes to mood/headaches: no Hair loss/changes to skin/nails: no Difficulty focusing/concentrating: no Sweating: no Limb weakness:  no Dizziness/lightheadedness: no Palpitations: no  Carbonated/caffeinated beverages: no N/V/D/C/Gas: no Abdominal pain: no Dumping syndrome: no    NUTRITION DIAGNOSIS  Overweight/obesity ([place]-3.3) related to past poor dietary habits and physical inactivity as evidenced by completed bariatric surgery and following dietary guidelines for continued weight loss and healthy nutrition status.     NUTRITION INTERVENTION Nutrition counseling (C-1) and education (E-2) to facilitate bariatric surgery goals, including: The importance of consuming adequate calories as well as certain nutrients daily due to the body's need for essential vitamins, minerals, and fats The importance of daily physical activity and to reach a goal of at least 150 minutes of moderate to vigorous physical activity weekly (or as directed by their physician) due to benefits such as increased musculature and improved lab values The importance of intuitive eating specifically learning hunger-satiety cues and understanding the importance of learning a new body: The importance of mindful eating to avoid grazing behaviors  Encouraged patient to honor their body's internal hunger and fullness cues.  Throughout the day, check in mentally and rate hunger. Stop eating when satisfied not full regardless of how much food is left on the plate.  Get more if still hungry 20-30 minutes later.  The key is to honor satisfaction so throughout the meal, rate fullness factor  and stop when comfortably satisfied not physically full. The key is to honor hunger and fullness without any feelings of guilt or shame.  Pay attention to what the internal cues are, rather than any external factors. This will enhance the confidence you have in listening to your own body and following those internal cues enabling you to increase how often you eat when you are hungry not out of appetite and stop when you are satisfied not full.  Encouraged pt to continue to eat balanced  meals inclusive of non starchy vegetables 2 times a day 7 days a week Encouraged pt to choose lean protein sources: limiting beef, pork, sausage, hotdogs, and lunch meat Encourage pt to choose healthy fats such as plant based limiting animal fats Encouraged pt to continue to drink a minium 64 fluid ounces with half being plain water to satisfy proper hydration    Learning Style & Readiness for Change Teaching method utilized: Visual & Auditory  Demonstrated degree of understanding via: Teach Back  Readiness Level: action Barriers to learning/adherence to lifestyle change: non-identified  RD's Notes for Next Visit Assess adherence to pt chosen goals   MONITORING & EVALUATION Dietary intake, weekly physical activity, body weight.  Next Steps Patient is to follow-up in 3 months

## 2022-02-06 DIAGNOSIS — Z9884 Bariatric surgery status: Secondary | ICD-10-CM | POA: Diagnosis not present

## 2022-02-11 DIAGNOSIS — M9905 Segmental and somatic dysfunction of pelvic region: Secondary | ICD-10-CM | POA: Diagnosis not present

## 2022-02-11 DIAGNOSIS — M9903 Segmental and somatic dysfunction of lumbar region: Secondary | ICD-10-CM | POA: Diagnosis not present

## 2022-02-11 DIAGNOSIS — M62830 Muscle spasm of back: Secondary | ICD-10-CM | POA: Diagnosis not present

## 2022-02-11 DIAGNOSIS — M9902 Segmental and somatic dysfunction of thoracic region: Secondary | ICD-10-CM | POA: Diagnosis not present

## 2022-02-17 ENCOUNTER — Encounter (HOSPITAL_BASED_OUTPATIENT_CLINIC_OR_DEPARTMENT_OTHER): Payer: Self-pay

## 2022-02-17 ENCOUNTER — Ambulatory Visit (HOSPITAL_BASED_OUTPATIENT_CLINIC_OR_DEPARTMENT_OTHER): Payer: Federal, State, Local not specified - PPO | Admitting: Nurse Practitioner

## 2022-02-18 DIAGNOSIS — M9903 Segmental and somatic dysfunction of lumbar region: Secondary | ICD-10-CM | POA: Diagnosis not present

## 2022-02-18 DIAGNOSIS — M9902 Segmental and somatic dysfunction of thoracic region: Secondary | ICD-10-CM | POA: Diagnosis not present

## 2022-02-18 DIAGNOSIS — M62451 Contracture of muscle, right thigh: Secondary | ICD-10-CM | POA: Diagnosis not present

## 2022-02-18 DIAGNOSIS — M9905 Segmental and somatic dysfunction of pelvic region: Secondary | ICD-10-CM | POA: Diagnosis not present

## 2022-02-25 DIAGNOSIS — M9902 Segmental and somatic dysfunction of thoracic region: Secondary | ICD-10-CM | POA: Diagnosis not present

## 2022-02-25 DIAGNOSIS — M9905 Segmental and somatic dysfunction of pelvic region: Secondary | ICD-10-CM | POA: Diagnosis not present

## 2022-02-25 DIAGNOSIS — M62451 Contracture of muscle, right thigh: Secondary | ICD-10-CM | POA: Diagnosis not present

## 2022-02-25 DIAGNOSIS — M9903 Segmental and somatic dysfunction of lumbar region: Secondary | ICD-10-CM | POA: Diagnosis not present

## 2022-03-04 DIAGNOSIS — M9902 Segmental and somatic dysfunction of thoracic region: Secondary | ICD-10-CM | POA: Diagnosis not present

## 2022-03-04 DIAGNOSIS — M9905 Segmental and somatic dysfunction of pelvic region: Secondary | ICD-10-CM | POA: Diagnosis not present

## 2022-03-04 DIAGNOSIS — M62451 Contracture of muscle, right thigh: Secondary | ICD-10-CM | POA: Diagnosis not present

## 2022-03-04 DIAGNOSIS — M9903 Segmental and somatic dysfunction of lumbar region: Secondary | ICD-10-CM | POA: Diagnosis not present

## 2022-03-11 DIAGNOSIS — Z23 Encounter for immunization: Secondary | ICD-10-CM | POA: Diagnosis not present

## 2022-03-11 DIAGNOSIS — M549 Dorsalgia, unspecified: Secondary | ICD-10-CM | POA: Diagnosis not present

## 2022-03-11 DIAGNOSIS — R7309 Other abnormal glucose: Secondary | ICD-10-CM | POA: Diagnosis not present

## 2022-03-11 DIAGNOSIS — M9903 Segmental and somatic dysfunction of lumbar region: Secondary | ICD-10-CM | POA: Diagnosis not present

## 2022-03-11 DIAGNOSIS — M9905 Segmental and somatic dysfunction of pelvic region: Secondary | ICD-10-CM | POA: Diagnosis not present

## 2022-03-11 DIAGNOSIS — M9902 Segmental and somatic dysfunction of thoracic region: Secondary | ICD-10-CM | POA: Diagnosis not present

## 2022-03-11 DIAGNOSIS — I10 Essential (primary) hypertension: Secondary | ICD-10-CM | POA: Diagnosis not present

## 2022-03-11 DIAGNOSIS — M62451 Contracture of muscle, right thigh: Secondary | ICD-10-CM | POA: Diagnosis not present

## 2022-03-11 DIAGNOSIS — Z0000 Encounter for general adult medical examination without abnormal findings: Secondary | ICD-10-CM | POA: Diagnosis not present

## 2022-03-11 DIAGNOSIS — Z9884 Bariatric surgery status: Secondary | ICD-10-CM | POA: Diagnosis not present

## 2022-03-11 DIAGNOSIS — Z13220 Encounter for screening for lipoid disorders: Secondary | ICD-10-CM | POA: Diagnosis not present

## 2022-03-16 DIAGNOSIS — Z1211 Encounter for screening for malignant neoplasm of colon: Secondary | ICD-10-CM | POA: Diagnosis not present

## 2022-03-16 DIAGNOSIS — M9902 Segmental and somatic dysfunction of thoracic region: Secondary | ICD-10-CM | POA: Diagnosis not present

## 2022-03-16 DIAGNOSIS — M62451 Contracture of muscle, right thigh: Secondary | ICD-10-CM | POA: Diagnosis not present

## 2022-03-16 DIAGNOSIS — M9905 Segmental and somatic dysfunction of pelvic region: Secondary | ICD-10-CM | POA: Diagnosis not present

## 2022-03-16 DIAGNOSIS — M9903 Segmental and somatic dysfunction of lumbar region: Secondary | ICD-10-CM | POA: Diagnosis not present

## 2022-03-25 DIAGNOSIS — M9902 Segmental and somatic dysfunction of thoracic region: Secondary | ICD-10-CM | POA: Diagnosis not present

## 2022-03-25 DIAGNOSIS — M9905 Segmental and somatic dysfunction of pelvic region: Secondary | ICD-10-CM | POA: Diagnosis not present

## 2022-03-25 DIAGNOSIS — M9903 Segmental and somatic dysfunction of lumbar region: Secondary | ICD-10-CM | POA: Diagnosis not present

## 2022-03-25 DIAGNOSIS — M62451 Contracture of muscle, right thigh: Secondary | ICD-10-CM | POA: Diagnosis not present

## 2022-03-31 DIAGNOSIS — M9902 Segmental and somatic dysfunction of thoracic region: Secondary | ICD-10-CM | POA: Diagnosis not present

## 2022-03-31 DIAGNOSIS — M62451 Contracture of muscle, right thigh: Secondary | ICD-10-CM | POA: Diagnosis not present

## 2022-03-31 DIAGNOSIS — M9903 Segmental and somatic dysfunction of lumbar region: Secondary | ICD-10-CM | POA: Diagnosis not present

## 2022-03-31 DIAGNOSIS — M9905 Segmental and somatic dysfunction of pelvic region: Secondary | ICD-10-CM | POA: Diagnosis not present

## 2022-04-07 DIAGNOSIS — M9902 Segmental and somatic dysfunction of thoracic region: Secondary | ICD-10-CM | POA: Diagnosis not present

## 2022-04-07 DIAGNOSIS — M9903 Segmental and somatic dysfunction of lumbar region: Secondary | ICD-10-CM | POA: Diagnosis not present

## 2022-04-07 DIAGNOSIS — M9905 Segmental and somatic dysfunction of pelvic region: Secondary | ICD-10-CM | POA: Diagnosis not present

## 2022-04-07 DIAGNOSIS — M62451 Contracture of muscle, right thigh: Secondary | ICD-10-CM | POA: Diagnosis not present

## 2022-04-13 ENCOUNTER — Ambulatory Visit: Payer: Federal, State, Local not specified - PPO | Admitting: Family Medicine

## 2022-04-14 DIAGNOSIS — M9902 Segmental and somatic dysfunction of thoracic region: Secondary | ICD-10-CM | POA: Diagnosis not present

## 2022-04-14 DIAGNOSIS — M9905 Segmental and somatic dysfunction of pelvic region: Secondary | ICD-10-CM | POA: Diagnosis not present

## 2022-04-14 DIAGNOSIS — M9903 Segmental and somatic dysfunction of lumbar region: Secondary | ICD-10-CM | POA: Diagnosis not present

## 2022-04-14 DIAGNOSIS — M62451 Contracture of muscle, right thigh: Secondary | ICD-10-CM | POA: Diagnosis not present

## 2022-04-15 ENCOUNTER — Ambulatory Visit: Payer: Federal, State, Local not specified - PPO | Admitting: Skilled Nursing Facility1

## 2022-04-22 ENCOUNTER — Ambulatory Visit (HOSPITAL_BASED_OUTPATIENT_CLINIC_OR_DEPARTMENT_OTHER): Payer: Federal, State, Local not specified - PPO | Admitting: Obstetrics & Gynecology

## 2022-04-23 ENCOUNTER — Ambulatory Visit: Payer: Federal, State, Local not specified - PPO | Admitting: Dietician

## 2022-04-24 DIAGNOSIS — M9905 Segmental and somatic dysfunction of pelvic region: Secondary | ICD-10-CM | POA: Diagnosis not present

## 2022-04-24 DIAGNOSIS — M9903 Segmental and somatic dysfunction of lumbar region: Secondary | ICD-10-CM | POA: Diagnosis not present

## 2022-04-24 DIAGNOSIS — M62451 Contracture of muscle, right thigh: Secondary | ICD-10-CM | POA: Diagnosis not present

## 2022-04-24 DIAGNOSIS — M9902 Segmental and somatic dysfunction of thoracic region: Secondary | ICD-10-CM | POA: Diagnosis not present

## 2022-05-01 DIAGNOSIS — M62451 Contracture of muscle, right thigh: Secondary | ICD-10-CM | POA: Diagnosis not present

## 2022-05-01 DIAGNOSIS — M9903 Segmental and somatic dysfunction of lumbar region: Secondary | ICD-10-CM | POA: Diagnosis not present

## 2022-05-01 DIAGNOSIS — M9902 Segmental and somatic dysfunction of thoracic region: Secondary | ICD-10-CM | POA: Diagnosis not present

## 2022-05-01 DIAGNOSIS — M9905 Segmental and somatic dysfunction of pelvic region: Secondary | ICD-10-CM | POA: Diagnosis not present

## 2022-05-05 ENCOUNTER — Other Ambulatory Visit (HOSPITAL_COMMUNITY)
Admission: RE | Admit: 2022-05-05 | Discharge: 2022-05-05 | Disposition: A | Discharge status: Home / Self Care | Payer: Federal, State, Local not specified - PPO | Source: Ambulatory Visit | Attending: Advanced Practice Midwife | Admitting: Advanced Practice Midwife

## 2022-05-05 ENCOUNTER — Ambulatory Visit (HOSPITAL_BASED_OUTPATIENT_CLINIC_OR_DEPARTMENT_OTHER): Payer: Federal, State, Local not specified - PPO | Admitting: Advanced Practice Midwife

## 2022-05-05 ENCOUNTER — Encounter (HOSPITAL_BASED_OUTPATIENT_CLINIC_OR_DEPARTMENT_OTHER): Payer: Self-pay | Admitting: Advanced Practice Midwife

## 2022-05-05 VITALS — BP 134/86 | HR 71 | Ht 62.0 in | Wt 254.8 lb

## 2022-05-05 DIAGNOSIS — N950 Postmenopausal bleeding: Secondary | ICD-10-CM

## 2022-05-05 DIAGNOSIS — Z124 Encounter for screening for malignant neoplasm of cervix: Secondary | ICD-10-CM | POA: Insufficient documentation

## 2022-05-05 DIAGNOSIS — Z01419 Encounter for gynecological examination (general) (routine) without abnormal findings: Secondary | ICD-10-CM | POA: Diagnosis not present

## 2022-05-05 NOTE — Progress Notes (Signed)
   Subjective:     Kelli Simmons is a 62 y.o. female here at [facility] for a routine exam.  Current complaints: vaginal spotting and cramping for 3-4 days last week.  LMP 2018, with an episode of bleeding in 2022 with benign endometrial biopsy.  Personal health questionnaire reviewed: yes.  Do you have a primary care provider? yes Do you feel safe at home? yes  [facility] Visit from 05/05/2022 in [place]  PHQ-2 Total Score 0       Health Maintenance Due  Topic Date Due   HIV Screening  Never done   Hepatitis C Screening  Never done   DTaP/Tdap/Td (1 - Tdap) Never done   PAP SMEAR-Modifier  Never done   COLONOSCOPY (Pts 45-22yrs Insurance coverage will need to be confirmed)  Never done   Zoster Vaccines- Shingrix (1 of 2) Never done   INFLUENZA VACCINE  Never done   COVID-19 Vaccine (7 - 2023-24 season) 12/26/2021     Risk factors for chronic health problems: Smoking: former cigarette smoker, quit 1990 Alchohol/how much: occasional Pt BMI: Body mass index is 46.6 kg/m.   Gynecologic History No LMP recorded (lmp unknown). Patient is postmenopausal. Contraception: post menopausal status Last Pap: 2021 per pt. Results were: normal Last mammogram: 08/2021. Results were: normal  Obstetric History OB History  Gravida Para Term Preterm AB Living  3         1  SAB IAB Ectopic Multiple Live Births               # Outcome Date GA Lbr Len/2nd Weight Sex Delivery Anes PTL Lv  3 Gravida           2 Gravida           1 Gravida              The following portions of the patient's history were reviewed and updated as appropriate: allergies, current medications, past family history, past medical history, past social history, past surgical history, and problem list.  Review of Systems Pertinent items noted in HPI and remainder of comprehensive ROS otherwise negative.    Objective:   VS reviewed, nursing note reviewed,  Constitutional: well  developed, well nourished, no distress HEENT: normocephalic, thyroid without enlargement or mass HEART: RRR, no murmurs rubs/gallops RESP: clear and equal to auscultation bilaterally in all lobes  Breast Exam:  exam performed: right breast normal without mass, skin or nipple changes or axillary nodes, left breast normal without mass, skin or nipple changes or axillary nodes Abdomen: soft Neuro: alert and oriented x 3 Skin: warm, dry Psych: affect normal Pelvic exam: Performed: Cervix pink, visually closed, without lesion, scant white creamy discharge, vaginal walls and external genitalia normal Bimanual exam: Cervix 0/long/high, firm, anterior, neg CMT, uterus nontender, nonenlarged, adnexa without tenderness, enlargement, or mass        Assessment/Plan:   1. Postmenopausal bleeding --Bimanual wnl today, no bleeding x 1 week --[name] at [place] ordered for next week with Dr Sabra Heck --Discussed endometrial biopsy with pt which may be performed after [name]  2. Well woman exam with routine gynecological exam  - Cytology - PAP( [place])  3. Screening for cervical cancer  - Cytology - PAP( [place])     Return in about 1 week (around 05/12/2022) for [name] in our office in one week.   Fatima Blank, CNM 1:07 PM

## 2022-05-06 DIAGNOSIS — Z0289 Encounter for other administrative examinations: Secondary | ICD-10-CM

## 2022-05-07 LAB — CYTOLOGY - PAP
Adequacy: ABSENT
Comment: NEGATIVE
Diagnosis: NEGATIVE
High risk HPV: NEGATIVE

## 2022-05-08 DIAGNOSIS — M62451 Contracture of muscle, right thigh: Secondary | ICD-10-CM | POA: Diagnosis not present

## 2022-05-08 DIAGNOSIS — M9902 Segmental and somatic dysfunction of thoracic region: Secondary | ICD-10-CM | POA: Diagnosis not present

## 2022-05-08 DIAGNOSIS — M9905 Segmental and somatic dysfunction of pelvic region: Secondary | ICD-10-CM | POA: Diagnosis not present

## 2022-05-08 DIAGNOSIS — M9903 Segmental and somatic dysfunction of lumbar region: Secondary | ICD-10-CM | POA: Diagnosis not present

## 2022-05-11 ENCOUNTER — Ambulatory Visit: Payer: Federal, State, Local not specified - PPO | Admitting: Dietician

## 2022-05-13 ENCOUNTER — Ambulatory Visit (HOSPITAL_BASED_OUTPATIENT_CLINIC_OR_DEPARTMENT_OTHER): Payer: Federal, State, Local not specified - PPO | Admitting: Obstetrics & Gynecology

## 2022-05-13 ENCOUNTER — Other Ambulatory Visit (HOSPITAL_BASED_OUTPATIENT_CLINIC_OR_DEPARTMENT_OTHER): Payer: Self-pay | Admitting: Obstetrics & Gynecology

## 2022-05-13 ENCOUNTER — Ambulatory Visit (INDEPENDENT_AMBULATORY_CARE_PROVIDER_SITE_OTHER): Payer: Federal, State, Local not specified - PPO

## 2022-05-13 ENCOUNTER — Encounter (HOSPITAL_BASED_OUTPATIENT_CLINIC_OR_DEPARTMENT_OTHER): Payer: Self-pay

## 2022-05-13 ENCOUNTER — Encounter (HOSPITAL_BASED_OUTPATIENT_CLINIC_OR_DEPARTMENT_OTHER): Payer: Self-pay | Admitting: Obstetrics & Gynecology

## 2022-05-13 VITALS — BP 150/79 | HR 75 | Ht 62.0 in | Wt 258.0 lb

## 2022-05-13 DIAGNOSIS — R935 Abnormal findings on diagnostic imaging of other abdominal regions, including retroperitoneum: Secondary | ICD-10-CM

## 2022-05-13 DIAGNOSIS — N950 Postmenopausal bleeding: Secondary | ICD-10-CM | POA: Diagnosis not present

## 2022-05-16 NOTE — Progress Notes (Signed)
GYNECOLOGY  VISIT  CC:   follow up after ultrasound  HPI: 62 y.o. G3P0 Married Unavailable female here for discussion of ultrasound done due to PMP bleeding.  Pt reports she is not have any current bleeding.  She does have hx of endometrial polyp with hysteroscopy resection about 18 months ago. She saw Kelli Simmons 05/05/2022 and pap smear was normal.    Uterus 8 x 6 x 4.4cm  endometrium is thin in lower portion of uterus but towards fundus, endometrium is 7.15mm and there are some cystic structures in the fundal region as well.  Discussed with pt this is likely endometrial polyps.  Office biopsy with possible hysteroscopy vs hysteroscopy discussed.  Pt has undergone office biopsy in the past and this was not a pleasant experience for her.  She declines doing this today and desires to proceed with hysteroscopy and possible poly resection, D&C.    Procedure discussed with patient.  Recovery and pain management discussed.  Risks discussed including but not limited to bleeding, rare risk of transfusion, infection, 1% risk of uterine perforation with risks of fluid deficit causing cardiac arrythmia, cerebral swelling and/or need to stop procedure early.  Fluid emboli and rare risk of death discussed.  DVT/PE, rare risk of risk of bowel/bladder/ureteral/vascular injury.  Patient aware if pathology abnormal she may need additional treatment.  All questions answered.    Past Medical History:  Diagnosis Date   Ankylosing spondylitis (HCC)    spine   Back pain    BMI 50.0-59.9, adult (HCC)    Endometrial thickening on ultrasound    GERD (gastroesophageal reflux disease)    Hypertension    Osteoarthritis    knees    MEDS:   Current Outpatient Medications on File Prior to Visit  Medication Sig Dispense Refill   amLODipine (NORVASC) 5 MG tablet Take 5 mg by mouth every evening.     calcium carbonate (OS-CAL) 1250 (500 Ca) MG chewable tablet Chew 1 tablet by mouth daily.     Cholecalciferol  (VITAMIN D3) 125 MCG (5000 UT) CHEW Chew 5,000 Units by mouth daily.     fluticasone (FLONASE) 50 MCG/ACT nasal spray Place 2 sprays into both nostrils in the morning.     loratadine (CLARITIN) 10 MG tablet Take 10 mg by mouth in the morning.     Melatonin 10 MG TABS Take by mouth. chewable     Multiple Vitamin (MULTIVITAMIN WITH MINERALS) TABS tablet Take 1 tablet by mouth in the morning.     Multiple Vitamins-Minerals (EMERGEN-C IMMUNE) PACK Take 1 Package by mouth daily.     pantoprazole (PROTONIX) 40 MG tablet Take 1 tablet (40 mg total) by mouth daily. Take this medication daily, regardless of reflux symptoms 90 tablet 0   Probiotic Product (ALIGN) CHEW Chew 1 tablet by mouth daily.     traMADol (ULTRAM) 50 MG tablet Take 1 tablet (50 mg total) by mouth every 6 (six) hours as needed (pain). 10 tablet 0   No current facility-administered medications on file prior to visit.    ALLERGIES: Patient has no known allergies.  SH:  married, non smoker  Review of Systems  Constitutional: Negative.   Genitourinary:        PMP bleeding    PHYSICAL EXAMINATION:    BP (!) 150/79   Pulse 75   Ht 5\' 2"  (1.575 m)   Wt 258 lb (117 kg)   LMP  (LMP Unknown)   BMI 47.19 kg/m     General  appearance: alert, cooperative and appears stated age   Assessment/Plan: 1. Postmenopausal bleeding - findings c/w endometrial polyp - declined endometrial biopsy today  2. Abnormal ultrasound of endometrium - hysteroscopy polyp resection, D&C recommended.  Pt ok to proceed with scheduling.

## 2022-05-19 DIAGNOSIS — M62451 Contracture of muscle, right thigh: Secondary | ICD-10-CM | POA: Diagnosis not present

## 2022-05-19 DIAGNOSIS — M9905 Segmental and somatic dysfunction of pelvic region: Secondary | ICD-10-CM | POA: Diagnosis not present

## 2022-05-19 DIAGNOSIS — M9903 Segmental and somatic dysfunction of lumbar region: Secondary | ICD-10-CM | POA: Diagnosis not present

## 2022-05-19 DIAGNOSIS — M9902 Segmental and somatic dysfunction of thoracic region: Secondary | ICD-10-CM | POA: Diagnosis not present

## 2022-05-20 ENCOUNTER — Encounter (HOSPITAL_BASED_OUTPATIENT_CLINIC_OR_DEPARTMENT_OTHER): Payer: Self-pay | Admitting: Obstetrics & Gynecology

## 2022-05-26 ENCOUNTER — Encounter (HOSPITAL_BASED_OUTPATIENT_CLINIC_OR_DEPARTMENT_OTHER): Payer: Self-pay

## 2022-05-27 ENCOUNTER — Ambulatory Visit (INDEPENDENT_AMBULATORY_CARE_PROVIDER_SITE_OTHER): Payer: Federal, State, Local not specified - PPO | Admitting: Family Medicine

## 2022-05-27 ENCOUNTER — Encounter (INDEPENDENT_AMBULATORY_CARE_PROVIDER_SITE_OTHER): Payer: Self-pay | Admitting: Family Medicine

## 2022-05-27 VITALS — BP 113/73 | HR 81 | Temp 98.3°F | Ht 63.0 in

## 2022-05-27 DIAGNOSIS — Z87891 Personal history of nicotine dependence: Secondary | ICD-10-CM

## 2022-05-27 DIAGNOSIS — I10 Essential (primary) hypertension: Secondary | ICD-10-CM

## 2022-05-27 DIAGNOSIS — K219 Gastro-esophageal reflux disease without esophagitis: Secondary | ICD-10-CM

## 2022-05-27 DIAGNOSIS — R5383 Other fatigue: Secondary | ICD-10-CM | POA: Diagnosis not present

## 2022-05-27 DIAGNOSIS — E669 Obesity, unspecified: Secondary | ICD-10-CM

## 2022-05-27 DIAGNOSIS — Z1331 Encounter for screening for depression: Secondary | ICD-10-CM

## 2022-05-27 DIAGNOSIS — M9902 Segmental and somatic dysfunction of thoracic region: Secondary | ICD-10-CM | POA: Diagnosis not present

## 2022-05-27 DIAGNOSIS — Z903 Acquired absence of stomach [part of]: Secondary | ICD-10-CM | POA: Diagnosis not present

## 2022-05-27 DIAGNOSIS — M9903 Segmental and somatic dysfunction of lumbar region: Secondary | ICD-10-CM | POA: Diagnosis not present

## 2022-05-27 DIAGNOSIS — E559 Vitamin D deficiency, unspecified: Secondary | ICD-10-CM

## 2022-05-27 DIAGNOSIS — R7303 Prediabetes: Secondary | ICD-10-CM | POA: Diagnosis not present

## 2022-05-27 DIAGNOSIS — M9905 Segmental and somatic dysfunction of pelvic region: Secondary | ICD-10-CM | POA: Diagnosis not present

## 2022-05-27 DIAGNOSIS — R0602 Shortness of breath: Secondary | ICD-10-CM | POA: Diagnosis not present

## 2022-05-27 DIAGNOSIS — E7800 Pure hypercholesterolemia, unspecified: Secondary | ICD-10-CM

## 2022-05-27 DIAGNOSIS — Z6841 Body Mass Index (BMI) 40.0 and over, adult: Secondary | ICD-10-CM

## 2022-05-27 DIAGNOSIS — M62451 Contracture of muscle, right thigh: Secondary | ICD-10-CM | POA: Diagnosis not present

## 2022-05-28 LAB — COMPREHENSIVE METABOLIC PANEL
ALT: 21 IU/L (ref 0–32)
AST: 27 IU/L (ref 0–40)
Albumin/Globulin Ratio: 1.3 (ref 1.2–2.2)
Albumin: 4.1 g/dL (ref 3.9–4.9)
Alkaline Phosphatase: 114 IU/L (ref 44–121)
BUN/Creatinine Ratio: 15 (ref 12–28)
BUN: 13 mg/dL (ref 8–27)
Bilirubin Total: 0.3 mg/dL (ref 0.0–1.2)
CO2: 24 mmol/L (ref 20–29)
Calcium: 9.5 mg/dL (ref 8.7–10.3)
Chloride: 103 mmol/L (ref 96–106)
Creatinine, Ser: 0.85 mg/dL (ref 0.57–1.00)
Globulin, Total: 3.2 g/dL (ref 1.5–4.5)
Glucose: 88 mg/dL (ref 70–99)
Potassium: 4.4 mmol/L (ref 3.5–5.2)
Sodium: 142 mmol/L (ref 134–144)
Total Protein: 7.3 g/dL (ref 6.0–8.5)
eGFR: 78 mL/min/{1.73_m2} (ref >59)

## 2022-05-28 LAB — CBC WITH DIFFERENTIAL/PLATELET
Basophils Absolute: 0 10*3/uL (ref 0.0–0.2)
Basos: 1 %
EOS (ABSOLUTE): 0.2 10*3/uL (ref 0.0–0.4)
Eos: 4 %
Hematocrit: 41.3 % (ref 34.0–46.6)
Hemoglobin: 13.5 g/dL (ref 11.1–15.9)
Immature Grans (Abs): 0 10*3/uL (ref 0.0–0.1)
Immature Granulocytes: 0 %
Lymphocytes Absolute: 2.1 10*3/uL (ref 0.7–3.1)
Lymphs: 52 %
MCH: 31 pg (ref 26.6–33.0)
MCHC: 32.7 g/dL (ref 31.5–35.7)
MCV: 95 fL (ref 79–97)
Monocytes Absolute: 0.3 10*3/uL (ref 0.1–0.9)
Monocytes: 7 %
Neutrophils Absolute: 1.5 10*3/uL (ref 1.4–7.0)
Neutrophils: 36 %
Platelets: 396 10*3/uL (ref 150–450)
RBC: 4.35 x10E6/uL (ref 3.77–5.28)
RDW: 11.8 % (ref 11.7–15.4)
WBC: 4 10*3/uL (ref 3.4–10.8)

## 2022-05-28 LAB — LIPID PANEL WITH LDL/HDL RATIO
Cholesterol, Total: 207 mg/dL — ABNORMAL HIGH (ref 100–199)
HDL: 72 mg/dL (ref >39)
LDL Chol Calc (NIH): 119 mg/dL — ABNORMAL HIGH (ref 0–99)
LDL/HDL Ratio: 1.7 ratio (ref 0.0–3.2)
Triglycerides: 93 mg/dL (ref 0–149)
VLDL Cholesterol Cal: 16 mg/dL (ref 5–40)

## 2022-05-28 LAB — HEMOGLOBIN A1C
Est. average glucose Bld gHb Est-mCnc: 108 mg/dL
Hgb A1c MFr Bld: 5.4 % (ref 4.8–5.6)

## 2022-05-28 LAB — VITAMIN D 25 HYDROXY (VIT D DEFICIENCY, FRACTURES): Vit D, 25-Hydroxy: 75.6 ng/mL (ref 30.0–100.0)

## 2022-05-28 LAB — INSULIN, RANDOM: INSULIN: 7.5 u[IU]/mL (ref 2.6–24.9)

## 2022-05-28 LAB — PREALBUMIN: PREALBUMIN: 26 mg/dL (ref 10–36)

## 2022-05-28 LAB — T3: T3, Total: 105 ng/dL (ref 71–180)

## 2022-05-28 LAB — T4, FREE: Free T4: 1.19 ng/dL (ref 0.82–1.77)

## 2022-05-28 LAB — TSH: TSH: 4.12 u[IU]/mL (ref 0.450–4.500)

## 2022-05-28 LAB — VITAMIN B12: Vitamin B-12: 1940 pg/mL — ABNORMAL HIGH (ref 232–1245)

## 2022-05-28 LAB — FOLATE: Folate: >20 ng/mL (ref >3.0)

## 2022-06-02 DIAGNOSIS — M9905 Segmental and somatic dysfunction of pelvic region: Secondary | ICD-10-CM | POA: Diagnosis not present

## 2022-06-02 DIAGNOSIS — M62451 Contracture of muscle, right thigh: Secondary | ICD-10-CM | POA: Diagnosis not present

## 2022-06-02 DIAGNOSIS — M9903 Segmental and somatic dysfunction of lumbar region: Secondary | ICD-10-CM | POA: Diagnosis not present

## 2022-06-02 DIAGNOSIS — M9902 Segmental and somatic dysfunction of thoracic region: Secondary | ICD-10-CM | POA: Diagnosis not present

## 2022-06-03 DIAGNOSIS — H25013 Cortical age-related cataract, bilateral: Secondary | ICD-10-CM | POA: Diagnosis not present

## 2022-06-03 DIAGNOSIS — H40022 Open angle with borderline findings, high risk, left eye: Secondary | ICD-10-CM | POA: Diagnosis not present

## 2022-06-04 ENCOUNTER — Encounter: Payer: Federal, State, Local not specified - PPO | Attending: Surgery | Admitting: Dietician

## 2022-06-04 ENCOUNTER — Encounter: Payer: Self-pay | Admitting: Dietician

## 2022-06-04 DIAGNOSIS — E669 Obesity, unspecified: Secondary | ICD-10-CM | POA: Insufficient documentation

## 2022-06-04 NOTE — Progress Notes (Signed)
Bariatric Nutrition Follow-Up Visit Medical Nutrition Therapy   Surgery Date: 08/05/2021 Sleeve Gastrectomy   NUTRITION ASSESSMENT  Anthropometrics  Start weight at [facility]: 320.5 Weight today: patient declined wt today  Body Composition Scale 10/01/2021 01/13/2022  Weight  lbs 293.1 269.3  Total Body Fat  % 51.3 49.8     Visceral Fat 26 23  Fat-Free Mass  % 48.6 50.1     Total Body Water  % 38.8 39.5     Muscle-Mass  lbs 28.6 28.2  BMI 53.5 49.2  Body Fat Displacement ---         Torso  lbs 93.3 83.3        Left Leg  lbs 18.6 16.6        Right Leg  lbs 18.6 16.6        Left Arm  lbs 9.3 8.3        Right Arm  lbs 9.3 8.3     Clinical  Medical hx: Arthritis, GERD, HTN, osteoarthritis, dysphagia  Medications: see list  Labs: Vit B12 1940; Chol 207; LDL 119 Notable signs/symptoms: N/A Any previous deficiencies? No  Lifestyle & Dietary Hx  Pt states she feels good. Pt states she eats about 1200 calories per day. Pt states she works out at [employer] 6 days a week mixing up cardio and weights.  Pt states she sees the benefits of working out and feels s much stronger and not in as mush pain so it keeps her motivated to be active.  Pt states her goal is to work again now that she has less pain and more energy.  Pt states this is the first time in her life that she has put herself first, stating she sees the reward and feels stronger and more confident. Pt states she lost 85 lbs, but states she feels like she lost a whole person.   Estimated daily fluid intake: 80+ oz Estimated daily protein intake: 60 g Supplements: multi vitamin and calcium Current average weekly physical activity: 6 days a week 60-90 minutes cardio and weight training   24-Hr Dietary Recall First Meal: water, coffee + cream, pure protein bar  Snack: fruit, beef stick, quest chips Second Meal: salad with [place] or chicken, tomato, cheese, 1/2 tomato bisk soup or chicken noodle Snack: cheese its (27) or nuts  or sf pop cycle Third Meal: other half of the soup and salad Snack: cheese its or sugar free popcicle Beverages: water, coffee  Post-Op Goals/ Signs/ Symptoms Using straws: no Drinking while eating: no Chewing/swallowing difficulties: no Changes in vision: no Changes to mood/headaches: no Hair loss/changes to skin/nails: no Difficulty focusing/concentrating: no Sweating: no Limb weakness: no Dizziness/lightheadedness: no Palpitations: no  Carbonated/caffeinated beverages: no N/V/D/C/Gas: no Abdominal pain: no Dumping syndrome: no    NUTRITION DIAGNOSIS  Overweight/obesity ([place]-3.3) related to past poor dietary habits and physical inactivity as evidenced by completed bariatric surgery and following dietary guidelines for continued weight loss and healthy nutrition status.     NUTRITION INTERVENTION Nutrition counseling (C-1) and education (E-2) to facilitate bariatric surgery goals, including: The importance of consuming adequate calories as well as certain nutrients daily due to the body's need for essential vitamins, minerals, and fats The importance of daily physical activity and to reach a goal of at least 150 minutes of moderate to vigorous physical activity weekly (or as directed by their physician) due to benefits such as increased musculature and improved lab values The importance of intuitive eating specifically learning hunger-satiety cues and  understanding the importance of learning a new body: The importance of mindful eating to avoid grazing behaviors  Encouraged patient to honor their body's internal hunger and fullness cues.  Throughout the day, check in mentally and rate hunger. Stop eating when satisfied not full regardless of how much food is left on the plate.  Get more if still hungry 20-30 minutes later.  The key is to honor satisfaction so throughout the meal, rate fullness factor and stop when comfortably satisfied not physically full. The key is to honor hunger  and fullness without any feelings of guilt or shame.  Pay attention to what the internal cues are, rather than any external factors. This will enhance the confidence you have in listening to your own body and following those internal cues enabling you to increase how often you eat when you are hungry not out of appetite and stop when you are satisfied not full.  Encouraged pt to continue to eat balanced meals inclusive of non starchy vegetables 2 times a day 7 days a week Encouraged pt to choose lean protein sources: limiting beef, pork, sausage, hotdogs, and lunch meat Encourage pt to choose healthy fats such as plant based limiting animal fats Encouraged pt to continue to drink a minium 64 fluid ounces with half being plain water to satisfy proper hydration   Learning Style & Readiness for Change Teaching method utilized: Visual & Auditory  Demonstrated degree of understanding via: Teach Back  Readiness Level: action Barriers to learning/adherence to lifestyle change: non-identified  RD's Notes for Next Visit Assess adherence to pt chosen goals  MONITORING & EVALUATION Dietary intake, weekly physical activity, body weight.  Next Steps Patient is to follow-up in 2 months for 1 year follow-up

## 2022-06-08 NOTE — Progress Notes (Signed)
Chief Complaint:   OBESITY Kelli Simmons (MR# [id number]) is a 62 y.o. female who presents for evaluation and treatment of obesity and related comorbidities. Current BMI is Body mass index is 45.7 kg/m. Kelli Simmons has been struggling with Kelli Simmons weight for many years and has been unsuccessful in either losing weight, maintaining weight loss, or reaching Kelli Simmons healthy weight goal.  Kelli Simmons is a returning patient (last seen on 04/08/21).  S/P Gastric Sleeve on 08/05/21.  Coffee in am with cream + protein bar (200 cal, 19 grams).  Fruit (pears or oranges) 1/2-1 cup.  Protein chips + beef stick (9g protein, 120 cal).  Soup (tomato bisque, 170cal/2g) + salad.  Dinner will be rest of soup and another salad.  After dinner will be peanuts or popcorn.  Analysse is currently in the action stage of change and ready to dedicate time achieving and maintaining a healthier weight. Kelli Simmons is interested in becoming our patient and working on intensive lifestyle modifications including (but not limited to) diet and exercise for weight loss.  Kelli Simmons's habits were reviewed today and are as follows: Kelli Simmons family eats meals together, she thinks Kelli Simmons family will eat healthier with Kelli Simmons, Kelli Simmons desired weight loss is 70 ponds, she has been heavy most of Kelli Simmons life, she started gaining weight after marriage, Kelli Simmons heaviest weight ever was 400 pounds, she is a picky eater and doesn't like to eat healthier foods, she snacks frequently in the evenings, she has problems with excessive hunger, she frequently eats larger portions than normal, and she struggles with emotional eating.  Depression Screen [name]'s Food and Mood (modified PHQ-9) score was 12.     05/05/2022   11:49 AM  Depression screen PHQ 2/9  Decreased Interest 0  Down, Depressed, Hopeless 0  PHQ - 2 Score 0   Subjective:   1. Other fatigue IC at 1st appointment in 2021 of 1511, now IC of 1613.  EKG done Feb 2023 of sinus tachycardia.   Previously on 1150-1250 for calories  and 80 grams of protein daily.  Khaleelah admits to daytime somnolence and reports waking up still tired. Patient has a history of symptoms of daytime fatigue. Chandy generally gets 9 hours of sleep per night, and states that she has nightime awakenings. Snoring is not present. Apneic episodes are not present. Epworth Sleepiness Score is 2.    2. SOBOE (shortness of breath on exertion) Tierrah notes increasing shortness of breath with exercising and seems to be worsening over time with weight gain. She notes getting out of breath sooner with activity than she used to. This has not gotten worse recently. Tancy denies shortness of breath at rest or orthopnea.   3. Essential hypertension On 2.5 mg Amlodipine currently.  Denies chest pain, chest pressure and headache.  4. Prediabetes Elevated A1c previously.  Previously on Wegov[place].  5. Vitamin D deficiency On a gummy.  Notes fatigue.  6. Pure hypercholesterolemia Last LDL of 111, HDL of 71, Trig of 131.  7. Gastroesophageal reflux disease, unspecified whether esophagitis present Symptoms much improved with weight loss.  Still taking Protonix.  8. History of sleeve gastrectomy Down 70 lbs so far.  Assessment/Plan:   1. Other fatigue We will obtain labs today.   [name] does feel that Kelli Simmons weight is causing Kelli Simmons energy to be lower than it should be. Fatigue may be related to obesity, depression or many other causes. Labs will be ordered, and in the meanwhile, Kelli Simmons will focus on self care including  making healthy food choices, increasing physical activity and focusing on stress reduction.   - TSH - T4, free - T3  2. SOBOE (shortness of breath on exertion) We will obtain labs today.  Kelli Simmons does feel that she gets out of breath more easily that she used to when she exercises. Kelli Simmons's shortness of breath appears to be obesity related and exercise induced. She has agreed to work on weight loss and gradually increase exercise to treat Kelli Simmons exercise  induced shortness of breath. Will continue to monitor closely.   - CBC with Differential/Platelet  3. Essential hypertension We will obtain labs today.  - Comprehensive metabolic panel  4. Prediabetes We will obtain labs today.  - Hemoglobin A1c - Insulin, random  5. Vitamin D deficiency We will obtain labs today.  - VITAMIN D 25 Hydroxy (Vit-D Deficiency, Fractures)  6. Pure hypercholesterolemia We will obtain labs today.  - Lipid Panel With LDL/HDL Ratio  7. Gastroesophageal reflux disease, unspecified whether esophagitis present Continue with current treatment without change in medication or dosage.  8. History of sleeve gastrectomy We will obtain labs today.  - Vitamin B12 - Folate - Prealbumin  9. Depression screening Kelli Simmons had a positive depression screening. Depression is commonly associated with obesity and often results in emotional eating behaviors. We will monitor this closely and work on CBT to help improve the non-hunger eating patterns. Referral to Psychology may be required if no improvement is seen as she continues in our clinic.   10. BMI 40.0-44.9, adult ([place])  11. Obesity with starting BMI of 51.21 [name] is currently in the action stage of change and Kelli Simmons goal is to continue with weight loss efforts. I recommend Kelli Simmons begin the structured treatment plan as follows:  She has agreed to the Category 2 Plan +100.  Exercise goals: As is.   Behavioral modification strategies: increasing lean protein intake, meal planning and cooking strategies, keeping healthy foods in the home, and planning for success.  She was informed of the importance of frequent follow-up visits to maximize Kelli Simmons success with intensive lifestyle modifications for Kelli Simmons multiple health conditions. She was informed we would discuss Kelli Simmons lab results at Kelli Simmons next visit unless there is a critical issue that needs to be addressed sooner. Kelli Simmons agreed to keep Kelli Simmons next visit at the agreed upon  time to discuss these results.  Objective:   Blood pressure 113/73, pulse 81, temperature 98.3 F (36.8 C), height '5\' 3"'$  (1.6 m), SpO2 100 %. Body mass index is 45.7 kg/m.  EKG: Normal sinus rhythm, rate EKG done in Feb 2023 of sinus tachycardia.  Indirect Calorimeter completed today shows a VO2 of 233 and a REE of 1613.  Kelli Simmons calculated basal metabolic rate is 99991111 thus Kelli Simmons basal metabolic rate is worse than expected.  General: Cooperative, alert, well developed, in no acute distress. HEENT: Conjunctivae and lids unremarkable. Cardiovascular: Regular rhythm.  Lungs: Normal work of breathing. Neurologic: No focal deficits.   Lab Results  Component Value Date   CREATININE 0.85 05/27/2022   BUN 13 05/27/2022   NA 142 05/27/2022   K 4.4 05/27/2022   CL 103 05/27/2022   CO2 24 05/27/2022   Lab Results  Component Value Date   ALT 21 05/27/2022   AST 27 05/27/2022   ALKPHOS 114 05/27/2022   BILITOT 0.3 05/27/2022   Lab Results  Component Value Date   HGBA1C 5.4 05/27/2022   HGBA1C 5.8 (H) 12/23/2020   HGBA1C 5.0 06/12/2020   HGBA1C 5.5  12/20/2019   Lab Results  Component Value Date   INSULIN 7.5 05/27/2022   INSULIN 13.7 12/23/2020   INSULIN 9.7 06/12/2020   INSULIN 14.9 12/20/2019   Lab Results  Component Value Date   TSH 4.120 05/27/2022   Lab Results  Component Value Date   CHOL 207 (H) 05/27/2022   HDL 72 05/27/2022   LDLCALC 119 (H) 05/27/2022   TRIG 93 05/27/2022   CHOLHDL 2.9 12/23/2020   Lab Results  Component Value Date   WBC 4.0 05/27/2022   HGB 13.5 05/27/2022   HCT 41.3 05/27/2022   MCV 95 05/27/2022   PLT 396 05/27/2022   No results found for: "IRON", "TIBC", "FERRITIN"  Attestation Statements:   Reviewed by clinician on day of visit: allergies, medications, problem list, medical history, surgical history, family history, social history, and previous encounter notes.  Time spent on visit including pre-visit chart review and post-visit  charting and care was 45 minutes.   I, Elnora Morrison, RMA am acting as transcriptionist for Coralie Common, MD.  This is the patient's first visit at [facility]. The patient's NEW PATIENT PACKET was reviewed at length. Included in the packet: current and past health history, medications, allergies, ROS, gynecologic history (women only), surgical history, family history, social history, weight history, weight loss surgery history (for those that have had weight loss surgery), nutritional evaluation, mood and food questionnaire, PHQ9, Epworth questionnaire, sleep habits questionnaire, patient life and health improvement goals questionnaire. These will all be scanned into the patient's chart under media.   During the visit, I independently reviewed the patient's EKG, bioimpedance scale results, and indirect calorimeter results. I used this information to tailor a meal plan for the patient that will help Kelli Simmons to lose weight and will improve Kelli Simmons obesity-related conditions going forward. I performed a medically necessary appropriate examination and/or evaluation. I discussed the assessment and treatment plan with the patient. The patient was provided an opportunity to ask questions and all were answered. The patient agreed with the plan and demonstrated an understanding of the instructions. Labs were ordered at this visit and will be reviewed at the next visit unless more critical results need to be addressed immediately. Clinical information was updated and documented in the EMR.   I have reviewed the above documentation for accuracy and completeness, and I agree with the above. - Coralie Common, MD

## 2022-06-09 DIAGNOSIS — M62451 Contracture of muscle, right thigh: Secondary | ICD-10-CM | POA: Diagnosis not present

## 2022-06-09 DIAGNOSIS — M9903 Segmental and somatic dysfunction of lumbar region: Secondary | ICD-10-CM | POA: Diagnosis not present

## 2022-06-09 DIAGNOSIS — M9905 Segmental and somatic dysfunction of pelvic region: Secondary | ICD-10-CM | POA: Diagnosis not present

## 2022-06-09 DIAGNOSIS — M9902 Segmental and somatic dysfunction of thoracic region: Secondary | ICD-10-CM | POA: Diagnosis not present

## 2022-06-10 ENCOUNTER — Encounter (INDEPENDENT_AMBULATORY_CARE_PROVIDER_SITE_OTHER): Payer: Self-pay | Admitting: Family Medicine

## 2022-06-10 ENCOUNTER — Ambulatory Visit (INDEPENDENT_AMBULATORY_CARE_PROVIDER_SITE_OTHER): Payer: Federal, State, Local not specified - PPO | Admitting: Family Medicine

## 2022-06-10 VITALS — BP 126/80 | HR 97 | Temp 98.4°F | Ht 63.0 in

## 2022-06-10 DIAGNOSIS — E669 Obesity, unspecified: Secondary | ICD-10-CM | POA: Diagnosis not present

## 2022-06-10 DIAGNOSIS — Z6841 Body Mass Index (BMI) 40.0 and over, adult: Secondary | ICD-10-CM

## 2022-06-10 DIAGNOSIS — E7849 Other hyperlipidemia: Secondary | ICD-10-CM | POA: Diagnosis not present

## 2022-06-10 DIAGNOSIS — E559 Vitamin D deficiency, unspecified: Secondary | ICD-10-CM | POA: Diagnosis not present

## 2022-06-10 DIAGNOSIS — R7303 Prediabetes: Secondary | ICD-10-CM

## 2022-06-10 NOTE — Progress Notes (Deleted)
Patient has added more complex carbs during her initial few weeks.  She has seen the dietician.  She is splitting up the meals.  Getting in half a sandwich at lunch and half a sandwich at dinner. Breakfast is the protein bar and supplements.  Lunch is 1/3 of the sandwich with a bit of a can of mandarin oranges.  Gets home around 3pm and then eats another 1/3 of the sandwich.   Supper is around 6pm and the last bit of the sandwich and a salad.  She may also do multigrain crackers or sugar free popsicles.  She is noticing less hunger at night.  Next few weeks she has an upcoming hysteroscopy.  Has two upcoming trips with her husband in [place] and then [place].

## 2022-06-17 ENCOUNTER — Other Ambulatory Visit (HOSPITAL_BASED_OUTPATIENT_CLINIC_OR_DEPARTMENT_OTHER): Payer: Self-pay | Admitting: Obstetrics & Gynecology

## 2022-06-17 ENCOUNTER — Encounter (HOSPITAL_BASED_OUTPATIENT_CLINIC_OR_DEPARTMENT_OTHER): Payer: Self-pay | Admitting: Obstetrics & Gynecology

## 2022-06-17 DIAGNOSIS — Z01812 Encounter for preprocedural laboratory examination: Secondary | ICD-10-CM

## 2022-06-22 ENCOUNTER — Ambulatory Visit (INDEPENDENT_AMBULATORY_CARE_PROVIDER_SITE_OTHER): Payer: Federal, State, Local not specified - PPO | Admitting: Adult Health

## 2022-06-22 ENCOUNTER — Encounter (INDEPENDENT_AMBULATORY_CARE_PROVIDER_SITE_OTHER): Payer: Self-pay | Admitting: Adult Health

## 2022-06-22 VITALS — BP 132/82 | HR 80 | Temp 98.0°F | Ht 63.0 in | Wt 247.0 lb

## 2022-06-22 DIAGNOSIS — Z9884 Bariatric surgery status: Secondary | ICD-10-CM

## 2022-06-22 DIAGNOSIS — R7303 Prediabetes: Secondary | ICD-10-CM | POA: Diagnosis not present

## 2022-06-22 DIAGNOSIS — E669 Obesity, unspecified: Secondary | ICD-10-CM

## 2022-06-22 DIAGNOSIS — Z6841 Body Mass Index (BMI) 40.0 and over, adult: Secondary | ICD-10-CM | POA: Diagnosis not present

## 2022-06-22 DIAGNOSIS — E559 Vitamin D deficiency, unspecified: Secondary | ICD-10-CM

## 2022-06-22 NOTE — Progress Notes (Signed)
Chief Complaint:   OBESITY Kelli Simmons is here to discuss her progress with her obesity treatment plan along with follow-up of her obesity related diagnoses. Kelli Simmons is on the Category 2 Plan and states she is following her eating plan approximately 90% of the time. Kelli Simmons states she is going [employer] 90 minutes 6 times per week.  Today's visit was #: 2 Starting weight: 247 lbs Starting date: 05/27/2022 Today's weight: 247 lbs Today's date: 06/10/2022 Total lbs lost to date: 0 lbs Total lbs lost since last in-office visit: 0  Interim History: Kelli Simmons has added more complex carbs during her initial few weeks.  She has seen the dietician.  She is splitting up the meals.  Getting in half a sandwich at lunch and half a sandwich at dinner. Breakfast is the protein bar and supplements.  Lunch is 1/3 of the sandwich with a bit of a can of mandarin oranges.  Gets home around 3pm and then eats another 1/3 of the sandwich.   Supper is around 6pm and the last bit of the sandwich and a salad.  She may also do multigrain crackers or sugar free popsicles.  She is noticing less hunger at night.  Next few weeks she has an upcoming hysteroscopy.  Has two upcoming trips with her husband in [place] and then [place].  Subjective:   1. Other hyperlipidemia LDL of 119, HDL of 72, Trig of 93.  Not on medication.  2. Vitamin D deficiency Kelli Simmons on 5k IU daily.  Denies any nausea, vomiting or muscle weakness.  She notes fatigue.  3. Prediabetes Kelli Simmons not on medications.  Prior A1c at 5.8 without insulin mid teens.  Recent labs A1c 5.4, insulin 7.5.  Assessment/Plan:   1. Other hyperlipidemia Continue mindful food intake.  Focus on increased protein intake.  2. Vitamin D deficiency Continue over the counter Vit D.  3. Prediabetes Repeat labs in 4-6 months.  4. BMI 40.0-44.9, adult ([place])  5. Obesity with starting BMI of 45.7 Kelli Simmons is currently in the action stage of change. As such, her goal is to  continue with weight loss efforts. She has agreed to keeping a food journal and adhering to recommended goals of 1200-1300 calories and 85+ grams of protein.   Exercise goals: As is.  Behavioral modification strategies: increasing lean protein intake, meal planning and cooking strategies, keeping healthy foods in the home, and planning for success.  Kelli Simmons has agreed to follow-up with our clinic in 3 weeks. She was informed of the importance of frequent follow-up visits to maximize her success with intensive lifestyle modifications for her multiple health conditions.   Objective:   Blood pressure 126/80, pulse 97, temperature 98.4 F (36.9 C), height '5\' 3"'$  (1.6 m), SpO2 95 %. Body mass index is 45.7 kg/m.  General: Cooperative, alert, well developed, in no acute distress. HEENT: Conjunctivae and lids unremarkable. Cardiovascular: Regular rhythm.  Lungs: Normal work of breathing. Neurologic: No focal deficits.   Lab Results  Component Value Date   CREATININE 0.85 05/27/2022   BUN 13 05/27/2022   NA 142 05/27/2022   K 4.4 05/27/2022   CL 103 05/27/2022   CO2 24 05/27/2022   Lab Results  Component Value Date   ALT 21 05/27/2022   AST 27 05/27/2022   ALKPHOS 114 05/27/2022   BILITOT 0.3 05/27/2022   Lab Results  Component Value Date   HGBA1C 5.4 05/27/2022   HGBA1C 5.8 (H) 12/23/2020   HGBA1C 5.0 06/12/2020   HGBA1C  5.5 12/20/2019   Lab Results  Component Value Date   INSULIN 7.5 05/27/2022   INSULIN 13.7 12/23/2020   INSULIN 9.7 06/12/2020   INSULIN 14.9 12/20/2019   Lab Results  Component Value Date   TSH 4.120 05/27/2022   Lab Results  Component Value Date   CHOL 207 (H) 05/27/2022   HDL 72 05/27/2022   LDLCALC 119 (H) 05/27/2022   TRIG 93 05/27/2022   CHOLHDL 2.9 12/23/2020   Lab Results  Component Value Date   VD25OH 75.6 05/27/2022   VD25OH 39.7 04/08/2021   VD25OH 40.9 12/23/2020   Lab Results  Component Value Date   WBC 4.0 05/27/2022   HGB  13.5 05/27/2022   HCT 41.3 05/27/2022   MCV 95 05/27/2022   PLT 396 05/27/2022   No results found for: "IRON", "TIBC", "FERRITIN"  Attestation Statements:   Reviewed by clinician on day of visit: allergies, medications, problem list, medical history, surgical history, family history, social history, and previous encounter notes.  Time spent on visit including pre-visit chart review and post-visit care and charting was 25 minutes.   I, Elnora Morrison, RMA am acting as transcriptionist for Coralie Common, MD.  I have reviewed the above documentation for accuracy and completeness, and I agree with the above. - Coralie Common, MD

## 2022-06-22 NOTE — Progress Notes (Signed)
Chief Complaint:   OBESITY Kelli Simmons is here to discuss her progress with her obesity treatment plan along with follow-up of her obesity related diagnoses. Kelli Simmons is on keeping a food journal and adhering to recommended goals of 1200-1300 calories and 85 protein and states she is following her eating plan approximately 90% of the time.  Kelli Simmons states she is weight training and participating in cardiovascular exercise 60 minutes 6 times per week.  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  She has really focused on proper nutrition and regular exercise the last 10 months.  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 Please see operative note. Preoperatively the patient was given 5000 units of subcutaneous heparin for DVT prophylaxis. Postoperative prophylactic Lovenox dosing was started on the evening of postoperative day 0. ERAS protocol was used. On the evening of postoperative day 0, the patient was started on water and ice chips. On postoperative day 1 the patient had no fever or tachycardia and was tolerating water in their diet was gradually advanced throughout the day. The patient was ambulating without difficulty. Their vital signs are stable without fever or tachycardia. Their hemoglobin had remained stable. The patient had received discharge instructions and counseling. They  were deemed stable for discharge and had met discharge criteria    2. Prediabetes Lab Results  Component Value Date   HGBA1C 5.4 05/27/2022   HGBA1C 5.8 (H) 12/23/2020   HGBA1C 5.0 06/12/2020   She was on [facility] therapy 12/2019-09/2020 She is not currently on any blood glucose lowering medications.  3. Vitamin D deficiency  Latest Reference Range & Units 05/27/22 09:43  Vitamin D, 25-Hydroxy 30.0 - 100.0 ng/mL 75.6- at goal!  Daily OTC Vit D3 5k/day  Assessment/Plan:   1. S/P laparoscopic sleeve gastrectomy F/u with Bariatric Surgeon and Nutritionist as directed.  2. Prediabetes Continue journaling plan and regular exercise  3. Vitamin D deficiency Continue daily supplementation.  4. Obesity with current BMI 43.9  Kelli Simmons is currently in the action stage of change. As such, her goal is to continue with weight loss efforts. She has agreed to keeping a food journal and adhering to recommended goals of 1200-1300 calories and 85 protein.   Exercise goals: For substantial health benefits, adults should do at least 150 minutes (2 hours and 30 minutes) a week of moderate-intensity, or 75 minutes (1 hour and 15 minutes) a week of vigorous-intensity aerobic physical activity, or an equivalent combination of moderate- and vigorous-intensity aerobic activity. Aerobic activity should be performed in episodes of at least 10 minutes, and preferably, it should be spread throughout the week.  Behavioral modification strategies: increasing lean protein intake, decreasing simple carbohydrates, increasing vegetables, increasing water intake, no skipping meals, keeping healthy foods in the home, dealing with family or coworker sabotage, and planning for success.  F/u 4 weeks.  Objective:   Blood pressure 132/82, pulse 80, temperature 98 F (36.7 C),  height '5\' 3"'$  (1.6 m), weight 247 lb (112 kg), SpO2 99 %. Body mass index is 43.75 kg/m.  General: Cooperative, alert, well developed, in no acute  distress. HEENT: Conjunctivae and lids unremarkable. Cardiovascular: Regular rhythm.  Lungs: Normal work of breathing. Neurologic: No focal deficits.   Lab Results  Component Value Date   CREATININE 0.85 05/27/2022   BUN 13 05/27/2022   NA 142 05/27/2022   K 4.4 05/27/2022   CL 103 05/27/2022   CO2 24 05/27/2022   Lab Results  Component Value Date   ALT 21 05/27/2022   AST 27 05/27/2022   ALKPHOS 114 05/27/2022   BILITOT 0.3 05/27/2022   Lab Results  Component Value Date   HGBA1C 5.4 05/27/2022   HGBA1C 5.8 (H) 12/23/2020   HGBA1C 5.0 06/12/2020   HGBA1C 5.5 12/20/2019   Lab Results  Component Value Date   INSULIN 7.5 05/27/2022   INSULIN 13.7 12/23/2020   INSULIN 9.7 06/12/2020   INSULIN 14.9 12/20/2019   Lab Results  Component Value Date   TSH 4.120 05/27/2022   Lab Results  Component Value Date   CHOL 207 (H) 05/27/2022   HDL 72 05/27/2022   LDLCALC 119 (H) 05/27/2022   TRIG 93 05/27/2022   CHOLHDL 2.9 12/23/2020   Lab Results  Component Value Date   VD25OH 75.6 05/27/2022   VD25OH 39.7 04/08/2021   VD25OH 40.9 12/23/2020   Lab Results  Component Value Date   WBC 4.0 05/27/2022   HGB 13.5 05/27/2022   HCT 41.3 05/27/2022   MCV 95 05/27/2022   PLT 396 05/27/2022   No results found for: "IRON", "TIBC", "FERRITIN"  Attestation Statements:   Reviewed by clinician on day of visit: allergies, medications, problem list, medical history, surgical history, family history, social history, and previous encounter notes.  I have reviewed the above documentation for accuracy and completeness, and I agree with the above. -  Gurjit Loconte d. Beva Remund, NP-C

## 2022-07-07 ENCOUNTER — Telehealth: Payer: Self-pay | Admitting: Family Medicine

## 2022-07-08 ENCOUNTER — Telehealth: Payer: Self-pay | Admitting: Obstetrics & Gynecology

## 2022-07-09 ENCOUNTER — Encounter (HOSPITAL_BASED_OUTPATIENT_CLINIC_OR_DEPARTMENT_OTHER): Payer: Self-pay

## 2022-07-13 ENCOUNTER — Other Ambulatory Visit (HOSPITAL_BASED_OUTPATIENT_CLINIC_OR_DEPARTMENT_OTHER): Payer: Self-pay | Admitting: Obstetrics & Gynecology

## 2022-07-20 ENCOUNTER — Ambulatory Visit (INDEPENDENT_AMBULATORY_CARE_PROVIDER_SITE_OTHER): Payer: Federal, State, Local not specified - PPO | Admitting: Adult Health

## 2022-08-06 ENCOUNTER — Telehealth: Payer: Self-pay | Admitting: Family Medicine

## 2022-08-07 ENCOUNTER — Telehealth: Payer: Self-pay | Admitting: Family Medicine

## 2022-08-12 ENCOUNTER — Telehealth (HOSPITAL_BASED_OUTPATIENT_CLINIC_OR_DEPARTMENT_OTHER): Payer: Self-pay | Admitting: Obstetrics & Gynecology

## 2022-08-13 ENCOUNTER — Ambulatory Visit (INDEPENDENT_AMBULATORY_CARE_PROVIDER_SITE_OTHER): Payer: Federal, State, Local not specified - PPO | Admitting: Adult Health

## 2022-08-18 ENCOUNTER — Ambulatory Visit: Payer: Federal, State, Local not specified - PPO | Admitting: Dietician

## 2022-08-18 DIAGNOSIS — M9903 Segmental and somatic dysfunction of lumbar region: Secondary | ICD-10-CM | POA: Diagnosis not present

## 2022-08-18 DIAGNOSIS — M9905 Segmental and somatic dysfunction of pelvic region: Secondary | ICD-10-CM | POA: Diagnosis not present

## 2022-08-18 DIAGNOSIS — M62451 Contracture of muscle, right thigh: Secondary | ICD-10-CM | POA: Diagnosis not present

## 2022-08-18 DIAGNOSIS — M5431 Sciatica, right side: Secondary | ICD-10-CM | POA: Diagnosis not present

## 2022-08-25 DIAGNOSIS — M62451 Contracture of muscle, right thigh: Secondary | ICD-10-CM | POA: Diagnosis not present

## 2022-08-25 DIAGNOSIS — M9905 Segmental and somatic dysfunction of pelvic region: Secondary | ICD-10-CM | POA: Diagnosis not present

## 2022-08-25 DIAGNOSIS — M9903 Segmental and somatic dysfunction of lumbar region: Secondary | ICD-10-CM | POA: Diagnosis not present

## 2022-08-25 DIAGNOSIS — M5431 Sciatica, right side: Secondary | ICD-10-CM | POA: Diagnosis not present

## 2022-08-28 ENCOUNTER — Other Ambulatory Visit (HOSPITAL_BASED_OUTPATIENT_CLINIC_OR_DEPARTMENT_OTHER): Payer: Self-pay | Admitting: Family Medicine

## 2022-08-28 DIAGNOSIS — Z1231 Encounter for screening mammogram for malignant neoplasm of breast: Secondary | ICD-10-CM

## 2022-08-31 ENCOUNTER — Encounter (HOSPITAL_BASED_OUTPATIENT_CLINIC_OR_DEPARTMENT_OTHER): Payer: Self-pay | Admitting: Obstetrics & Gynecology

## 2022-08-31 NOTE — Progress Notes (Signed)
Spoke w/ via phone for pre-op interview--- Kelli Simmons Lab needs dos----   CBC and no pregnancy test per surgeon. EKG per anesthesia.            Lab results------ COVID test -----patient states asymptomatic no test needed Arrive at -------1230 NPO after MN NO Solid Food.  Clear liquids from MN until---1130 Med rec completed Medications to take morning of surgery -----Norvasc and Protonix. Ultram PRN Diabetic medication ----- Patient instructed no nail polish to be worn day of surgery Patient instructed to bring photo id and insurance card day of surgery Patient aware to have Driver (ride ) / caregiver  Kelli Simmons  for 24 hours after surgery  Patient Special Instructions ----- Pre-Op special Instructions ----- Patient verbalized understanding of instructions that were given at this phone interview. Patient denies shortness of breath, chest pain, fever, cough at this phone interview.

## 2022-09-02 DIAGNOSIS — M62451 Contracture of muscle, right thigh: Secondary | ICD-10-CM | POA: Diagnosis not present

## 2022-09-02 DIAGNOSIS — M5431 Sciatica, right side: Secondary | ICD-10-CM | POA: Diagnosis not present

## 2022-09-02 DIAGNOSIS — M9903 Segmental and somatic dysfunction of lumbar region: Secondary | ICD-10-CM | POA: Diagnosis not present

## 2022-09-02 DIAGNOSIS — M9905 Segmental and somatic dysfunction of pelvic region: Secondary | ICD-10-CM | POA: Diagnosis not present

## 2022-09-09 DIAGNOSIS — Z9884 Bariatric surgery status: Secondary | ICD-10-CM | POA: Diagnosis not present

## 2022-09-09 LAB — PROTEIN / CREATININE RATIO, URINE: Albumin, U: 4

## 2022-09-09 LAB — BASIC METABOLIC PANEL
BUN: 15 (ref 4–21)
CO2: 33 — AB (ref 13–22)
Chloride: 104 (ref 99–108)
Creatinine: 0.8 (ref 0.5–1.1)
Glucose: 96
Potassium: 4.2 mEq/L (ref 3.5–5.1)
Sodium: 143 (ref 137–147)

## 2022-09-09 LAB — COMPREHENSIVE METABOLIC PANEL
Calcium: 9.5 (ref 8.7–10.7)
eGFR: 83

## 2022-09-09 LAB — CBC AND DIFFERENTIAL
HCT: 39 (ref 36–46)
Hemoglobin: 12.7 (ref 12.0–16.0)
WBC: 4.2

## 2022-09-09 LAB — LIPID PANEL
Cholesterol: 214 — AB (ref 0–200)
HDL: 147 — AB (ref 35–70)
LDL Cholesterol: 131
Triglycerides: 89 (ref 40–160)

## 2022-09-09 LAB — HEMOGLOBIN A1C: Hemoglobin A1C: 5.3

## 2022-09-09 LAB — VITAMIN B12: Vitamin B-12: 1500

## 2022-09-09 LAB — CBC: RBC: 3.99 (ref 3.87–5.11)

## 2022-09-09 LAB — HEPATIC FUNCTION PANEL
ALT: 19 U/L (ref 7–35)
AST: 23 (ref 13–35)
Alkaline Phosphatase: 114 (ref 25–125)
Bilirubin, Total: 0.5

## 2022-09-09 LAB — VITAMIN D 25 HYDROXY (VIT D DEFICIENCY, FRACTURES): Vit D, 25-Hydroxy: 74.9

## 2022-09-15 ENCOUNTER — Ambulatory Visit (HOSPITAL_BASED_OUTPATIENT_CLINIC_OR_DEPARTMENT_OTHER): Payer: 59 | Admitting: Anesthesiology

## 2022-09-15 ENCOUNTER — Encounter (HOSPITAL_BASED_OUTPATIENT_CLINIC_OR_DEPARTMENT_OTHER): Payer: Self-pay | Admitting: Obstetrics & Gynecology

## 2022-09-15 ENCOUNTER — Other Ambulatory Visit: Payer: Self-pay

## 2022-09-15 ENCOUNTER — Encounter (HOSPITAL_BASED_OUTPATIENT_CLINIC_OR_DEPARTMENT_OTHER)
Admission: RE | Disposition: A | Discharge status: Home / Self Care | Payer: Self-pay | Source: Home / Self Care | Attending: Obstetrics & Gynecology

## 2022-09-15 ENCOUNTER — Ambulatory Visit (HOSPITAL_BASED_OUTPATIENT_CLINIC_OR_DEPARTMENT_OTHER)
Admission: RE | Admit: 2022-09-15 | Discharge: 2022-09-15 | Disposition: A | Discharge status: Home / Self Care | Payer: 59 | Attending: Obstetrics & Gynecology | Admitting: Obstetrics & Gynecology

## 2022-09-15 DIAGNOSIS — Z01812 Encounter for preprocedural laboratory examination: Secondary | ICD-10-CM

## 2022-09-15 DIAGNOSIS — Z539 Procedure and treatment not carried out, unspecified reason: Secondary | ICD-10-CM | POA: Insufficient documentation

## 2022-09-15 DIAGNOSIS — I10 Essential (primary) hypertension: Secondary | ICD-10-CM

## 2022-09-15 DIAGNOSIS — N950 Postmenopausal bleeding: Secondary | ICD-10-CM | POA: Diagnosis not present

## 2022-09-15 SURGERY — DILATATION AND CURETTAGE /HYSTEROSCOPY
Anesthesia: General

## 2022-09-15 MED ORDER — LACTATED RINGERS IV SOLN: INTRAVENOUS | Status: DC

## 2022-09-15 MED ORDER — POVIDONE-IODINE 10 % EX SWAB: 2.0000 | Freq: Once | CUTANEOUS | Status: DC

## 2022-09-15 SURGICAL SUPPLY — 5 items
DEVICE MYOSURE LITE (MISCELLANEOUS) IMPLANT
DEVICE MYOSURE REACH (MISCELLANEOUS) IMPLANT
DILATOR CANAL MILEX (MISCELLANEOUS) IMPLANT
LOOP CUTTING BIPOLAR 21FR (ELECTRODE) IMPLANT
SEAL CERVICAL OMNI LOK (ABLATOR) IMPLANT

## 2022-09-15 NOTE — Progress Notes (Signed)
Procedure cancelled. Patient ate protein bar at 0930. Dr. Hyacinth Meeker cancelled case.

## 2022-09-15 NOTE — Anesthesia Preprocedure Evaluation (Deleted)
Anesthesia Evaluation  Patient identified by MRN, date of birth, ID band Patient awake    Reviewed: Allergy & Precautions, H&P , NPO status , Patient's Chart, lab work & pertinent test results  Airway Mallampati: II  TM Distance: >3 FB Neck ROM: Full    Dental no notable dental hx.    Pulmonary neg pulmonary ROS, former smoker   Pulmonary exam normal breath sounds clear to auscultation       Cardiovascular hypertension, Normal cardiovascular exam Rhythm:Regular Rate:Normal     Neuro/Psych negative neurological ROS  negative psych ROS   GI/Hepatic Neg liver ROS,GERD  ,,  Endo/Other    Morbid obesity  Renal/GU negative Renal ROS  negative genitourinary   Musculoskeletal  (+) Arthritis ,  AS   Abdominal   Peds negative pediatric ROS (+)  Hematology negative hematology ROS (+)   Anesthesia Other Findings   Reproductive/Obstetrics negative OB ROS                             Anesthesia Physical Anesthesia Plan  ASA: 3  Anesthesia Plan: General   Post-op Pain Management: Toradol IV (intra-op)*   Induction: Intravenous and Rapid sequence  PONV Risk Score and Plan: 3 and Ondansetron, Dexamethasone and Treatment may vary due to age or medical condition  Airway Management Planned: Oral ETT  Additional Equipment:   Intra-op Plan:   Post-operative Plan: Extubation in OR  Informed Consent: I have reviewed the patients History and Physical, chart, labs and discussed the procedure including the risks, benefits and alternatives for the proposed anesthesia with the patient or authorized representative who has indicated his/her understanding and acceptance.     Dental advisory given  Plan Discussed with: CRNA and Surgeon  Anesthesia Plan Comments: (Protein bar at 0930)       Anesthesia Quick Evaluation

## 2022-09-16 ENCOUNTER — Other Ambulatory Visit: Payer: Self-pay

## 2022-09-17 ENCOUNTER — Telehealth (HOSPITAL_BASED_OUTPATIENT_CLINIC_OR_DEPARTMENT_OTHER): Payer: Self-pay | Admitting: *Deleted

## 2022-09-17 NOTE — Telephone Encounter (Signed)
Informed pt that Dr. Hyacinth Meeker would not be able to do her procedure tomorrow. Advised that it would probably be scheduled for June 18. Pt will reach out if she has not heard from scheduler by next week.

## 2022-09-17 NOTE — Telephone Encounter (Signed)
-----   Message from Jerene Bears, MD sent at 09/17/2022 11:25 AM EDT ----- Regarding: surgery Can you call pt and let her know I cannot do the hysteroscopy tomorrow.  Have sent message to scheduler for possibly the 18th.  I am sorry.  Thank you.  Kelli Simmons

## 2022-09-22 ENCOUNTER — Ambulatory Visit (INDEPENDENT_AMBULATORY_CARE_PROVIDER_SITE_OTHER): Payer: 59 | Admitting: Adult Health

## 2022-09-22 ENCOUNTER — Encounter (INDEPENDENT_AMBULATORY_CARE_PROVIDER_SITE_OTHER): Payer: Self-pay | Admitting: Adult Health

## 2022-09-22 VITALS — BP 120/76 | HR 78 | Temp 98.2°F | Ht 62.0 in | Wt 239.0 lb

## 2022-09-22 DIAGNOSIS — Z6841 Body Mass Index (BMI) 40.0 and over, adult: Secondary | ICD-10-CM | POA: Diagnosis not present

## 2022-09-22 DIAGNOSIS — E669 Obesity, unspecified: Secondary | ICD-10-CM | POA: Diagnosis not present

## 2022-09-22 DIAGNOSIS — E559 Vitamin D deficiency, unspecified: Secondary | ICD-10-CM

## 2022-09-22 DIAGNOSIS — E88819 Insulin resistance, unspecified: Secondary | ICD-10-CM | POA: Diagnosis not present

## 2022-09-22 NOTE — Progress Notes (Signed)
WEIGHT SUMMARY AND BIOMETRICS  Vitals Temp: 98.2 F (36.8 C) BP: 120/76 Pulse Rate: 78 SpO2: 97 %   Anthropometric Measurements Height: 5\' 2"  (1.575 m) Weight: 239 lb (108.4 kg) BMI (Calculated): 43.7 Weight at Last Visit: 247lb Weight Lost Since Last Visit: 8lb Weight Gained Since Last Visit: 0 Starting Weight: 247lb Total Weight Loss (lbs): 8 lb (3.629 kg)   Body Composition  Body Fat %: 52.6 % Fat Mass (lbs): 126 lbs Muscle Mass (lbs): 107.6 lbs Total Body Water (lbs): 81.2 lbs Visceral Fat Rating : 18   Other Clinical Data Fasting: no Labs: no Today's Visit #: 4 Starting Date: 12/20/19    Chief Complaint:   OBESITY Kelli Simmons is here to discuss her progress with her obesity treatment plan. She is on the the Category 2 Plan + 100 and states she is following her eating plan approximately 90 % of the time. She states she is exercising [employer] ([employer]) 60+ minutes 6 times per week.   Interim History:  Last OV at [facility] Hunger/appetite-she reports stable appetite levels. WGNFAO therapy 12/2019- 09/2018  Exercise-she has been exercising at [employer] to drink at least 50 oz water/day  Reviewed Bioem[employer] Results with pt: Muscle Mass: -0.4 lbs Adipose Mass: - 8 lbs   Of Note-  Kelli Simmons recently retired- which has allowed them both to remain home on a regular basis. Kelli Simmons reports that when she started at [facility] August 2022- she would experience dyspnea when walking from parking lot to exam room- now NONE!  Subjective:   1. Insulin resistance  Latest Reference Range & Units 05/27/22 09:43  Glucose 70 - 99 mg/dL 88  Hemoglobin Z3Y 4.8 - 5.6 % 5.4  Est. average glucose Bld gHb Est-mCnc mg/dL 865  INSULIN 2.6 - 78.4 uIU/mL 7.5  Previously on [facility]gov[facility] therapy 12/2019-09/2020  She denies polyphagia When she experiences a weight loss plateau- she increases daily protein intake.  2. Vitamin D deficiency  Latest  Reference Range & Units 12/23/20 12:38 04/08/21 16:13 05/27/22 09:43  Vitamin D, 25-Hydroxy 30.0 - 100.0 ng/mL 40.9 39.7 75.6  She is on OTC Vit D 3 5,000 IU gummy daily. She endorses stable energy levels.  Assessment/Plan:   1. Insulin resistance Increase protein and continue regular exercise  2. Vitamin D deficiency Continue OTC Vit D Supplementation  3. Obesity with current BMI 43.7  Kelli Simmons is currently in the action stage of change. As such, her goal is to continue with weight loss efforts. She has agreed to the Category 2 Plan. +100  Exercise goals: For substantial health benefits, adults should do at least 150 minutes (2 hours and 30 minutes) a week of moderate-intensity, or 75 minutes (1 hour and 15 minutes) a week of vigorous-intensity aerobic physical activity, or an equivalent combination of moderate- and vigorous-intensity aerobic activity. Aerobic activity should be performed in episodes of at least 10 minutes, and preferably, it should be spread throughout the week.  Behavioral modification strategies: increasing lean protein intake, decreasing simple carbohydrates, increasing vegetables, meal planning and cooking strategies, and planning for success.  Kelli Simmons has agreed to follow-up with our clinic in 4 weeks. She was informed of the importance of frequent follow-up visits to maximize her success with intensive lifestyle modifications for her multiple health conditions.   Objective:   Blood pressure 120/76, pulse 78, temperature 98.2 F (36.8 C), height 5\' 2"  (1.575 m), weight 239 lb (108.4 kg), SpO2 97 %. Body mass  index is 43.71 kg/m.  General: Cooperative, alert, well developed, in no acute distress. HEENT: Conjunctivae and lids unremarkable. Cardiovascular: Regular rhythm.  Lungs: Normal work of breathing. Neurologic: No focal deficits.   Lab Results  Component Value Date   CREATININE 0.85 05/27/2022   BUN 13 05/27/2022   NA 142 05/27/2022   K 4.4 05/27/2022    CL 103 05/27/2022   CO2 24 05/27/2022   Lab Results  Component Value Date   ALT 21 05/27/2022   AST 27 05/27/2022   ALKPHOS 114 05/27/2022   BILITOT 0.3 05/27/2022   Lab Results  Component Value Date   HGBA1C 5.4 05/27/2022   HGBA1C 5.8 (H) 12/23/2020   HGBA1C 5.0 06/12/2020   HGBA1C 5.5 12/20/2019   Lab Results  Component Value Date   INSULIN 7.5 05/27/2022   INSULIN 13.7 12/23/2020   INSULIN 9.7 06/12/2020   INSULIN 14.9 12/20/2019   Lab Results  Component Value Date   TSH 4.120 05/27/2022   Lab Results  Component Value Date   CHOL 207 (H) 05/27/2022   HDL 72 05/27/2022   LDLCALC 119 (H) 05/27/2022   TRIG 93 05/27/2022   CHOLHDL 2.9 12/23/2020   Lab Results  Component Value Date   VD25OH 75.6 05/27/2022   VD25OH 39.7 04/08/2021   VD25OH 40.9 12/23/2020   Lab Results  Component Value Date   WBC 4.0 05/27/2022   HGB 13.5 05/27/2022   HCT 41.3 05/27/2022   MCV 95 05/27/2022   PLT 396 05/27/2022   No results found for: "IRON", "TIBC", "FERRITIN"  Attestation Statements:   Reviewed by clinician on day of visit: allergies, medications, problem list, medical history, surgical history, family history, social history, and previous encounter notes.  Time spent on visit including pre-visit chart review and post-visit care and charting was 28 minutes.  I have reviewed the above documentation for accuracy and completeness, and I agree with the above. -  Shondra Capps d. Edge Mauger, NP-C

## 2022-09-24 ENCOUNTER — Ambulatory Visit (HOSPITAL_BASED_OUTPATIENT_CLINIC_OR_DEPARTMENT_OTHER)
Admission: RE | Admit: 2022-09-24 | Discharge: 2022-09-24 | Disposition: A | Discharge status: Home / Self Care | Payer: 59 | Source: Ambulatory Visit | Attending: Family Medicine | Admitting: Family Medicine

## 2022-09-24 DIAGNOSIS — Z1231 Encounter for screening mammogram for malignant neoplasm of breast: Secondary | ICD-10-CM | POA: Insufficient documentation

## 2022-09-29 DIAGNOSIS — M9903 Segmental and somatic dysfunction of lumbar region: Secondary | ICD-10-CM | POA: Diagnosis not present

## 2022-09-29 DIAGNOSIS — M9905 Segmental and somatic dysfunction of pelvic region: Secondary | ICD-10-CM | POA: Diagnosis not present

## 2022-09-29 DIAGNOSIS — M62451 Contracture of muscle, right thigh: Secondary | ICD-10-CM | POA: Diagnosis not present

## 2022-09-29 DIAGNOSIS — M5431 Sciatica, right side: Secondary | ICD-10-CM | POA: Diagnosis not present

## 2022-10-05 ENCOUNTER — Telehealth: Payer: Self-pay

## 2022-10-05 NOTE — Telephone Encounter (Signed)
Contacted patient to have surgery rescheduled w/ Dr. Hyacinth Meeker. Patient chose to have procedure on 11/03/22 at 2:30 pm. Provided patient with date, time, location and pre-op instructions. Patient is aware she must arrive at 12:30 pm.

## 2022-10-06 ENCOUNTER — Encounter (HOSPITAL_BASED_OUTPATIENT_CLINIC_OR_DEPARTMENT_OTHER): Payer: Self-pay

## 2022-10-12 DIAGNOSIS — M5431 Sciatica, right side: Secondary | ICD-10-CM | POA: Diagnosis not present

## 2022-10-12 DIAGNOSIS — M62451 Contracture of muscle, right thigh: Secondary | ICD-10-CM | POA: Diagnosis not present

## 2022-10-12 DIAGNOSIS — M9903 Segmental and somatic dysfunction of lumbar region: Secondary | ICD-10-CM | POA: Diagnosis not present

## 2022-10-12 DIAGNOSIS — M9905 Segmental and somatic dysfunction of pelvic region: Secondary | ICD-10-CM | POA: Diagnosis not present

## 2022-10-19 ENCOUNTER — Other Ambulatory Visit (HOSPITAL_BASED_OUTPATIENT_CLINIC_OR_DEPARTMENT_OTHER): Payer: Self-pay | Admitting: Obstetrics & Gynecology

## 2022-10-19 ENCOUNTER — Telehealth (HOSPITAL_BASED_OUTPATIENT_CLINIC_OR_DEPARTMENT_OTHER): Payer: Self-pay | Admitting: Obstetrics & Gynecology

## 2022-10-19 DIAGNOSIS — Z01818 Encounter for other preprocedural examination: Secondary | ICD-10-CM

## 2022-10-26 DIAGNOSIS — M9905 Segmental and somatic dysfunction of pelvic region: Secondary | ICD-10-CM | POA: Diagnosis not present

## 2022-10-26 DIAGNOSIS — M5431 Sciatica, right side: Secondary | ICD-10-CM | POA: Diagnosis not present

## 2022-10-26 DIAGNOSIS — M9903 Segmental and somatic dysfunction of lumbar region: Secondary | ICD-10-CM | POA: Diagnosis not present

## 2022-10-26 DIAGNOSIS — M62451 Contracture of muscle, right thigh: Secondary | ICD-10-CM | POA: Diagnosis not present

## 2022-10-30 ENCOUNTER — Encounter (HOSPITAL_COMMUNITY): Payer: Self-pay | Admitting: Obstetrics & Gynecology

## 2022-10-30 ENCOUNTER — Other Ambulatory Visit: Payer: Self-pay

## 2022-10-30 NOTE — Progress Notes (Signed)
PCP - [name], MD Cardiologist - Denies  EKG - DOS Chest x-ray - 06/19/2021 ECHO - Denies Cardiac Cath - Denies   Sleep Study-Denies  DM- Denies  Blood Thinner Instructions: N/A Aspirin Instructions: N/A  ERAS Protcol - Yes COVID TEST- No.  Pt denies any symptoms.    Anesthesia review: Yes  -------------  SDW INSTRUCTIONS:  Your procedure is scheduled on Tuesday July 9th. Please report to [facility] Main Entrance "A" at 1000 A.M., and check in at the Admitting office. Call this number if you have problems the morning of surgery: [phone]   Remember: Do not eat after midnight the night before your surgery  You may drink clear liquids until 0930 the morning of your surgery.   Clear liquids allowed are: Water, Non-Citrus Juices (without pulp), Carbonated Beverages, Clear Tea, Black Coffee Only, and Gatorade   Medications to take morning of surgery with a sip of water include: Amlodipine, Claritin and Protonix IF NEEDED Tylenol Ultram and Flonase  As of today, STOP taking any Aspirin (unless otherwise instructed by your surgeon), Aleve, Naproxen, Ibuprofen, Motrin, Advil, Goody's, BC's, all herbal medications, fish oil, and all vitamins.    The Morning of Surgery Do not wear jewelry, make-up or nail polish. Do not wear lotions, powders, or perfumes/colognes, or deodorant Do not bring valuables to the hospital. [facility] is not responsible for any belongings or valuables.  If you are a smoker, DO NOT Smoke 24 hours prior to surgery  If you wear a CPAP at night please bring your mask the morning of surgery   Remember that you must have someone to transport you home after your surgery, and remain with you for 24 hours if you are discharged the same day.  Please bring cases for contacts, glasses, hearing aids, dentures or bridgework because it cannot be worn into surgery.   Patients discharged the day of surgery will not be allowed to drive home.   Please  shower the NIGHT BEFORE/MORNING OF SURGERY (use antibacterial soap like DIAL soap if possible). Wear comfortable clothes the morning of surgery. Oral Hygiene is also important to reduce your risk of infection.  Remember - BRUSH YOUR TEETH THE MORNING OF SURGERY WITH YOUR REGULAR TOOTHPASTE  Patient denies shortness of breath, fever, cough and chest pain.

## 2022-10-30 NOTE — Progress Notes (Signed)
PCP - Blenda Bridegroom, MD  Cardiologist - Denies EKG - 06/19/2021.  Need repeat on DOS Chest x-ray - 06/19/2021 ECHO - Denies Cardiac Cath - Denies  Sleep Study   DM Denies  Blood Thinner Instructions: Denies Aspirin Instructions: Denies  ERAS Protcol - Yes COVID TEST- N/A  Anesthesia review: Yes  -------------  SDW INSTRUCTIONS:  Your procedure is scheduled on Tuesday July 9th. Please report to [facility] Main Entrance "A" at 1000 A.M., and check in at the Admitting office. Call this number if you have problems the morning of surgery: [phone]   Remember: Do not eat  after midnight the night before your surgery  You may drink clear liquids until 0930 the morning of your surgery.   Clear liquids allowed are: Water, Non-Citrus Juices (without pulp), Carbonated Beverages, Clear Tea, Black Coffee Only, and Gatorade   Medications to take morning of surgery with a sip of water include: amLODipine (NORVASC)  loratadine (CLARITIN)  pantoprazole (PROTONIX)   IF NEEDED acetaminophen (TYLENOL)  fluticasone (FLONASE)  traMADol (ULTRAM)    As of today, STOP taking any Aspirin (unless otherwise instructed by your surgeon), Aleve, Naproxen, Ibuprofen, Motrin, Advil, Goody's, BC's, all herbal medications, fish oil, and all vitamins.    The Morning of Surgery Do not wear jewelry, make-up or nail polish. Do not wear lotions, powders, or perfumes/colognes, or deodorant Do not bring valuables to the hospital. [facility] is not responsible for any belongings or valuables.  If you are a smoker, DO NOT Smoke 24 hours prior to surgery  If you wear a CPAP at night please bring your mask the morning of surgery   Remember that you must have someone to transport you home after your surgery, and remain with you for 24 hours if you are discharged the same day.  Please bring cases for contacts, glasses, hearing aids, dentures or bridgework because it cannot be worn into surgery.    Patients discharged the day of surgery will not be allowed to drive home.   Please shower the NIGHT BEFORE/MORNING OF SURGERY (use antibacterial soap like DIAL soap if possible). Wear comfortable clothes the morning of surgery. Oral Hygiene is also important to reduce your risk of infection.  Remember - BRUSH YOUR TEETH THE MORNING OF SURGERY WITH YOUR REGULAR TOOTHPASTE  Patient denies shortness of breath, fever, cough and chest pain.

## 2022-11-03 ENCOUNTER — Ambulatory Visit (HOSPITAL_COMMUNITY): Payer: 59 | Admitting: Physician Assistant

## 2022-11-03 ENCOUNTER — Ambulatory Visit (HOSPITAL_COMMUNITY)
Admission: RE | Admit: 2022-11-03 | Discharge: 2022-11-03 | Disposition: A | Discharge status: Home / Self Care | Payer: 59 | Attending: Obstetrics & Gynecology | Admitting: Obstetrics & Gynecology

## 2022-11-03 ENCOUNTER — Encounter (HOSPITAL_COMMUNITY)
Admission: RE | Disposition: A | Discharge status: Home / Self Care | Payer: Self-pay | Source: Home / Self Care | Attending: Obstetrics & Gynecology

## 2022-11-03 ENCOUNTER — Ambulatory Visit (HOSPITAL_BASED_OUTPATIENT_CLINIC_OR_DEPARTMENT_OTHER): Payer: 59 | Admitting: Physician Assistant

## 2022-11-03 DIAGNOSIS — I10 Essential (primary) hypertension: Secondary | ICD-10-CM

## 2022-11-03 DIAGNOSIS — R9389 Abnormal findings on diagnostic imaging of other specified body structures: Secondary | ICD-10-CM | POA: Insufficient documentation

## 2022-11-03 DIAGNOSIS — K219 Gastro-esophageal reflux disease without esophagitis: Secondary | ICD-10-CM | POA: Insufficient documentation

## 2022-11-03 DIAGNOSIS — Z6841 Body Mass Index (BMI) 40.0 and over, adult: Secondary | ICD-10-CM | POA: Insufficient documentation

## 2022-11-03 DIAGNOSIS — Z9884 Bariatric surgery status: Secondary | ICD-10-CM | POA: Insufficient documentation

## 2022-11-03 DIAGNOSIS — M459 Ankylosing spondylitis of unspecified sites in spine: Secondary | ICD-10-CM | POA: Insufficient documentation

## 2022-11-03 DIAGNOSIS — N840 Polyp of corpus uteri: Secondary | ICD-10-CM | POA: Diagnosis not present

## 2022-11-03 DIAGNOSIS — Z87891 Personal history of nicotine dependence: Secondary | ICD-10-CM | POA: Diagnosis not present

## 2022-11-03 DIAGNOSIS — Z01818 Encounter for other preprocedural examination: Secondary | ICD-10-CM

## 2022-11-03 DIAGNOSIS — R935 Abnormal findings on diagnostic imaging of other abdominal regions, including retroperitoneum: Secondary | ICD-10-CM

## 2022-11-03 DIAGNOSIS — Z09 Encounter for follow-up examination after completed treatment for conditions other than malignant neoplasm: Secondary | ICD-10-CM | POA: Diagnosis not present

## 2022-11-03 DIAGNOSIS — Z79899 Other long term (current) drug therapy: Secondary | ICD-10-CM | POA: Diagnosis not present

## 2022-11-03 DIAGNOSIS — N950 Postmenopausal bleeding: Secondary | ICD-10-CM | POA: Diagnosis not present

## 2022-11-03 HISTORY — PX: DILATATION & CURETTAGE/HYSTEROSCOPY WITH MYOSURE: SHX6511

## 2022-11-03 LAB — POCT I-STAT, CHEM 8
BUN: 17 mg/dL (ref 8–23)
Calcium, Ion: 0.96 mmol/L — ABNORMAL LOW (ref 1.15–1.40)
Chloride: 109 mmol/L (ref 98–111)
Creatinine, Ser: 0.8 mg/dL (ref 0.44–1.00)
Glucose, Bld: 86 mg/dL (ref 70–99)
HCT: 42 % (ref 36.0–46.0)
Hemoglobin: 14.3 g/dL (ref 12.0–15.0)
Potassium: 4 mmol/L (ref 3.5–5.1)
Sodium: 140 mmol/L (ref 135–145)
TCO2: 21 mmol/L — ABNORMAL LOW (ref 22–32)

## 2022-11-03 SURGERY — DILATATION & CURETTAGE/HYSTEROSCOPY WITH MYOSURE
Anesthesia: General | Site: Uterus

## 2022-11-03 MED ORDER — ORAL CARE MOUTH RINSE: 15.0000 mL | Freq: Once | OROMUCOSAL | Status: AC

## 2022-11-03 MED ORDER — LIDOCAINE 2% (20 MG/ML) 5 ML SYRINGE
INTRAMUSCULAR | Status: DC | PRN
  Administered 2022-11-03: 60 mg via INTRAVENOUS

## 2022-11-03 MED ORDER — HYDROCODONE-ACETAMINOPHEN 5-325 MG PO TABS: 1.0000 | ORAL_TABLET | Freq: Four times a day (QID) | ORAL | 0 refills | Status: DC | PRN

## 2022-11-03 MED ORDER — FENTANYL CITRATE (PF) 250 MCG/5ML IJ SOLN
INTRAMUSCULAR | Status: AC
  Filled 2022-11-03: qty 5

## 2022-11-03 MED ORDER — MIDAZOLAM HCL 2 MG/2ML IJ SOLN
INTRAMUSCULAR | Status: AC
  Filled 2022-11-03: qty 2

## 2022-11-03 MED ORDER — POVIDONE-IODINE 10 % EX SWAB
2.0000 | Freq: Once | CUTANEOUS | Status: AC
  Administered 2022-11-03: 2 via TOPICAL

## 2022-11-03 MED ORDER — OXYCODONE HCL 5 MG PO TABS
5.0000 mg | ORAL_TABLET | Freq: Once | ORAL | Status: AC | PRN
  Administered 2022-11-03: 5 mg via ORAL

## 2022-11-03 MED ORDER — FENTANYL CITRATE (PF) 100 MCG/2ML IJ SOLN: 25.0000 ug | INTRAMUSCULAR | Status: DC | PRN

## 2022-11-03 MED ORDER — PROPOFOL 10 MG/ML IV BOLUS
INTRAVENOUS | Status: AC
  Filled 2022-11-03: qty 20

## 2022-11-03 MED ORDER — MIDAZOLAM HCL 2 MG/2ML IJ SOLN
INTRAMUSCULAR | Status: DC | PRN
  Administered 2022-11-03: 2 mg via INTRAVENOUS

## 2022-11-03 MED ORDER — FENTANYL CITRATE (PF) 250 MCG/5ML IJ SOLN
INTRAMUSCULAR | Status: DC | PRN
  Administered 2022-11-03: 25 ug via INTRAVENOUS

## 2022-11-03 MED ORDER — ACETAMINOPHEN 500 MG PO TABS: 1000.0000 mg | ORAL_TABLET | ORAL | Status: DC

## 2022-11-03 MED ORDER — PROMETHAZINE HCL 25 MG/ML IJ SOLN: 6.2500 mg | INTRAMUSCULAR | Status: DC | PRN

## 2022-11-03 MED ORDER — DEXAMETHASONE SODIUM PHOSPHATE 10 MG/ML IJ SOLN
INTRAMUSCULAR | Status: DC | PRN
  Administered 2022-11-03: 5 mg via INTRAVENOUS

## 2022-11-03 MED ORDER — CHLORHEXIDINE GLUCONATE 0.12 % MT SOLN
15.0000 mL | Freq: Once | OROMUCOSAL | Status: AC
  Administered 2022-11-03: 15 mL via OROMUCOSAL
  Filled 2022-11-03: qty 15

## 2022-11-03 MED ORDER — LACTATED RINGERS IV SOLN: INTRAVENOUS | Status: DC

## 2022-11-03 MED ORDER — PHENYLEPHRINE HCL (PRESSORS) 10 MG/ML IV SOLN
INTRAVENOUS | Status: DC | PRN
  Administered 2022-11-03: 80 ug via INTRAVENOUS

## 2022-11-03 MED ORDER — OXYCODONE HCL 5 MG PO TABS
ORAL_TABLET | ORAL | Status: AC
  Filled 2022-11-03: qty 1

## 2022-11-03 MED ORDER — PHENYLEPHRINE 80 MCG/ML (10ML) SYRINGE FOR IV PUSH (FOR BLOOD PRESSURE SUPPORT)
PREFILLED_SYRINGE | INTRAVENOUS | Status: AC
  Filled 2022-11-03: qty 10

## 2022-11-03 MED ORDER — LIDOCAINE-EPINEPHRINE 1 %-1:100000 IJ SOLN
INTRAMUSCULAR | Status: DC | PRN
  Administered 2022-11-03: 10 mL

## 2022-11-03 MED ORDER — ACETAMINOPHEN 500 MG PO TABS
1000.0000 mg | ORAL_TABLET | Freq: Once | ORAL | Status: AC
  Administered 2022-11-03: 1000 mg via ORAL
  Filled 2022-11-03: qty 2

## 2022-11-03 MED ORDER — PROPOFOL 10 MG/ML IV BOLUS
INTRAVENOUS | Status: DC | PRN
  Administered 2022-11-03: 160 mg via INTRAVENOUS

## 2022-11-03 MED ORDER — KETOROLAC TROMETHAMINE 30 MG/ML IJ SOLN
INTRAMUSCULAR | Status: DC | PRN
  Administered 2022-11-03: 15 mg via INTRAVENOUS

## 2022-11-03 MED ORDER — ONDANSETRON HCL 4 MG/2ML IJ SOLN
INTRAMUSCULAR | Status: DC | PRN
  Administered 2022-11-03: 4 mg via INTRAVENOUS

## 2022-11-03 MED ORDER — OXYCODONE HCL 5 MG/5ML PO SOLN: 5.0000 mg | Freq: Once | ORAL | Status: AC | PRN

## 2022-11-03 SURGICAL SUPPLY — 12 items
DEVICE MYOSURE LITE (MISCELLANEOUS) ×1 IMPLANT
GLOVE BIOGEL PI IND STRL 7.0 (GLOVE) ×3 IMPLANT
GLOVE ECLIPSE 6.5 STRL STRAW (GLOVE) ×2 IMPLANT
GOWN STRL REUS W/TWL LRG LVL3 (GOWN DISPOSABLE) ×3 IMPLANT
IV NS IRRIG 3000ML ARTHROMATIC (IV SOLUTION) ×2 IMPLANT
KIT PROCEDURE FLUENT (KITS) ×2 IMPLANT
KIT TURNOVER CYSTO (KITS) ×2 IMPLANT
PACK VAGINAL MINOR WOMEN LF (CUSTOM PROCEDURE TRAY) ×2 IMPLANT
PAD OB MATERNITY 4.3X12.25 (PERSONAL CARE ITEMS) ×2 IMPLANT
SEAL ROD LENS SCOPE MYOSURE (ABLATOR) ×2 IMPLANT
SLEEVE SCD COMPRESS KNEE MED (STOCKING) ×2 IMPLANT
TOWEL OR 17X24 6PK STRL BLUE (TOWEL DISPOSABLE) ×2 IMPLANT

## 2022-11-03 NOTE — Anesthesia Postprocedure Evaluation (Signed)
Anesthesia Post Note  Patient: Kelli Simmons  Procedure(s) Performed: DILATATION & CURETTAGE/HYSTEROSCOPY WITH MYOSURE (Uterus)     Patient location during evaluation: PACU Anesthesia Type: General Level of consciousness: awake and alert Pain management: pain level controlled Vital Signs Assessment: post-procedure vital signs reviewed and stable Respiratory status: spontaneous breathing, nonlabored ventilation and respiratory function stable Cardiovascular status: stable and blood pressure returned to baseline Anesthetic complications: no   No notable events documented.  Last Vitals:  Vitals:   11/03/22 1230 11/03/22 1245  BP: 136/66 131/67  Pulse: 67 68  Resp: 15 18  Temp:    SpO2: 96% 96%    Last Pain:  Vitals:   11/03/22 1215  PainSc: 0-No pain                 Beryle Lathe

## 2022-11-03 NOTE — Op Note (Signed)
11/03/2022  12:36 PM  PATIENT:  Kelli Simmons  62 y.o. female  PRE-OPERATIVE DIAGNOSIS:  PMB Polyp  POST-OPERATIVE DIAGNOSIS:  PMB, Polyp  PROCEDURE:  Procedure(s): DILATATION & CURETTAGE/HYSTEROSCOPY WITH MYOSURE  SURGEON:  Jerene Bears  ASSISTANTS: OR staff.    ANESTHESIA:   general  ESTIMATED BLOOD LOSS: 5 mL  BLOOD ADMINISTERED:none   FLUIDS: 400 cc LR  UOP: pt voided before going back to the OR  SPECIMEN:  endometrial curetting and possible polyp  DISPOSITION OF SPECIMEN:  PATHOLOGY  FINDINGS: calcifications within the endometrial cavity and area of possible polyp on right lower lateral endometrial cavity.  This was completely removed with the procedure.  DESCRIPTION OF OPERATION: Patient was taken to the operating room.  She is placed in the supine position. SCDs were on her lower extremities and functioning properly. General anesthesia with an LMA was administered without difficulty. Dr. Mal Amabile, anesthesia, oversaw case.  Legs were then placed in the [facility] stirrups in the low lithotomy position. The legs were lifted to the high lithotomy position and the Betadine prep was used on the inner thighs perineum and vagina x3. Patient was draped in a normal standard fashion.  A bivalve speculum was placed the vagina. The anterior lip of the cervix was grasped with single-tooth tenaculum.  A paracervical block of 1% lidocaine mixed one-to-one with epinephrine (1:100,000 units).  10 cc was used total. The cervix is dilated up to #21 [facility] dilators. The endometrial cavity sounded to 8 cm.   A 5.5 millimeter Myosure hysteroscope was obtained. Normal saline was used as a hysteroscopic fluid. The hysteroscope was advanced through the endocervical canal into the endometrial cavity. The tubal ostia were noted bilaterally. Additional findings included area of thickened tissue with both ends attached to endometrium.  This does appear to look like a polyp but with attachments at both ends,  this is not completely consistent with a polyp.  The myosure lite device was obtained and the lesion resected.  Calcifications were present and these were removed as well.  The hysteroscope was removed. A #1 toothed curette was used to curette the cavity until rough gritty texture was noted in all quadrants. With revisualization of the hysteroscope, there was no longer any abnormal findings.  At this point no other procedure was needed and this procedure was ended. The hysteroscope was removed. The fluid deficit was 135 cc. The tenaculum was removed from the anterior lip of the cervix. The speculum was removed from the vagina. The prep was cleansed of the patient's skin. The legs are positioned back in the supine position. Sponge, lap, needle, instrument counts were correct x2. Patient was taken to recovery in stable condition.   COUNTS:  YES  PLAN OF CARE: Transfer to PACU

## 2022-11-03 NOTE — Anesthesia Procedure Notes (Signed)
Procedure Name: LMA Insertion Date/Time: 11/03/2022 11:39 AM  Performed by: Alease Medina, CRNAPre-anesthesia Checklist: Patient identified, Emergency Drugs available, Suction available and Patient being monitored Patient Re-evaluated:Patient Re-evaluated prior to induction Oxygen Delivery Method: Circle system utilized Preoxygenation: Pre-oxygenation with 100% oxygen Induction Type: IV induction Ventilation: Mask ventilation without difficulty Number of attempts: 1 Placement Confirmation: positive ETCO2, breath sounds checked- equal and bilateral and CO2 detector Tube secured with: Tape Dental Injury: Teeth and Oropharynx as per pre-operative assessment

## 2022-11-03 NOTE — Transfer of Care (Signed)
Immediate Anesthesia Transfer of Care Note  Patient: Kelli Simmons  Procedure(s) Performed: DILATATION & CURETTAGE/HYSTEROSCOPY WITH MYOSURE (Uterus)  Patient Location: PACU  Anesthesia Type:General  Level of Consciousness: drowsy and patient cooperative  Airway & Oxygen Therapy: Patient Spontanous Breathing  Post-op Assessment: Report given to RN, Post -op Vital signs reviewed and stable, and Patient moving all extremities X 4  Post vital signs: Reviewed and stable  Last Vitals:  Vitals Value Taken Time  BP 128/60 11/03/22 1215  Temp 36.4 C 11/03/22 1211  Pulse 69 11/03/22 1215  Resp 20 11/03/22 1215  SpO2 95 % 11/03/22 1215  Vitals shown include unvalidated device data.  Last Pain:  Vitals:   11/03/22 1054  PainSc: 0-No pain         Complications: No notable events documented.

## 2022-11-03 NOTE — Discharge Instructions (Signed)
Post-surgical Instructions, Outpatient Surgery  You may expect to feel dizzy, weak, and drowsy for as long as 24 hours after receiving the medicine that made you sleep (anesthetic). For the first 24 hours after your surgery:   Do not drive a car, ride a bicycle, participate in physical activities, or take public transportation until you are done taking narcotic pain medicines or as directed by Dr. Hyacinth Meeker.  Do not drink alcohol or take tranquilizers.  Do not take medicine that has not been prescribed by your physicians.  Do not sign important papers or make important decisions while on narcotic pain medicines.  Have a responsible person with you.   PAIN MANAGEMENT Vicodin 5/325mg .  For more severe pain, take one or two tablets every four to six hours as needed for pain control.  (Remember that narcotic pain medications increase your risk of constipation.  If this becomes a problem, you may take an over the counter stool softener like Colace 100mg  up to four times a day.)  If you take this, don't take any tylenol for the next 6 hours as this has tylenol in it.  DO'S AND DON'T'S Do not take a tub bath for one week.  You may shower on the first day after your surgery Do not do any heavy lifting for one to two weeks.  This increases the chance of bleeding. Do move around as you feel able.  Stairs are fine.  You may begin to exercise again as you feel able.  Do not lift any weights for two weeks. Do not put anything in the vagina for two weeks--no tampons, intercourse, or douching.    REGULAR MEDIATIONS/VITAMINS: You may restart all of your regular medications as prescribed. You may restart all of your vitamins as you normally take them.    PLEASE CALL OR SEEK MEDICAL CARE IF: You have persistent nausea and vomiting.  You have trouble eating or drinking.  You have an oral temperature above 100.5.  You have constipation that is not helped by adjusting diet or increasing fluid intake. Pain  medicines are a common cause of constipation.  You have heavy vaginal bleeding

## 2022-11-03 NOTE — H&P (Signed)
Kelli Simmons is an 62 y.o. female G3P3 AAF with hx of PMP bleeding.  Ultrasound was done and showed possible fundal polyp with uterus measuring 8 x 6 x 4.4cm with 7.72mm endometrium with cystic structure at the fundus.  Office biopsy recommended but pt declined due to prior experience with this.  Desired to have evaluation and treatment done in the operating room.  Pt has similar procedure done 8/245/2022 and had an endometrial polyp seen at that time as well.    She was scheduled previously for this but ate the morning of the procedure so had to be rescheduled.  Procedure, risks and benefits reviewed.  She desires to proceed and not have any procedures done in the office.  Pertinent Gynecological History: Menses: post-menopausal Bleeding: post menopausal bleeding Contraception: post menopausal status DES exposure: denies Blood transfusions: none Sexually transmitted diseases: no past history Previous GYN Procedures:  hysteroscopy with polyp resection   Last mammogram: normal Date: 09/25/2022 Last pap: normal Date: 05/05/2022 OB History: G3, P3   Menstrual History: No LMP recorded (lmp unknown). Patient is postmenopausal.    Past Medical History:  Diagnosis Date   Ankylosing spondylitis (HCC)    spine   Back pain    BMI 50.0-59.9, adult (HCC)    GERD (gastroesophageal reflux disease)    Hypertension    Joint pain    Low back pain    Osteoarthritis    knees    Past Surgical History:  Procedure Laterality Date   CESAREAN SECTION  1977   CESAREAN SECTION  1982   CHOLECYSTECTOMY  1994   DILATATION & CURETTAGE/HYSTEROSCOPY WITH MYOSURE N/A 12/19/2020   Procedure: DILATATION & CURETTAGE/HYSTEROSCOPY WITH MYOSURE;  Surgeon: Shea Evans, MD;  Location: [facility] OR;  Service: Gynecology;  Laterality: N/A;   LAPAROSCOPIC GASTRIC SLEEVE RESECTION N/A 08/05/2021   Procedure: LAPAROSCOPIC GASTRIC SLEEVE RESECTION;  Surgeon: Berna Bue, MD;  Location: [facility] ORS;  Service: General;   Laterality: N/A;   UMBILICAL EXPLORATION     6 mos   UMBILICAL HERNIA REPAIR  2001   UPPER GI ENDOSCOPY N/A 08/05/2021   Procedure: UPPER GI ENDOSCOPY;  Surgeon: Berna Bue, MD;  Location: [facility] ORS;  Service: General;  Laterality: N/A;    Family History  Problem Relation Age of Onset   Hypertension Mother    Hyperlipidemia Mother    Thyroid disease Mother    Depression Mother    Anxiety disorder Mother    Schizophrenia Mother    Obesity Mother    Heart disease Mother    Eating disorder Mother    Sudden death Father    Asthma Neg Hx    Eczema Neg Hx    Allergic Disorder Neg Hx     Social History:  reports that she quit smoking about 34 years ago. Her smoking use included cigarettes. She has a 32.00 pack-year smoking history. She has never used smokeless tobacco. She reports current alcohol use. She reports that she does not use drugs.  Allergies:  Allergies  Allergen Reactions   Soy Allergy Other (See Comments)    clusters of cysts    Medications Prior to Admission  Medication Sig Dispense Refill Last Dose   acetaminophen (TYLENOL) 500 MG tablet Take 500 mg by mouth every 6 (six) hours as needed for moderate pain or mild pain (Knee and back).   11/03/2022 at 0815   amLODipine (NORVASC) 2.5 MG tablet Take 2.5 mg by mouth daily.   11/02/2022   calcium carbonate (OS-CAL)  1250 (500 Ca) MG chewable tablet Chew 1 tablet by mouth daily.   Past Week   Cholecalciferol (VITAMIN D3) 125 MCG (5000 UT) CHEW Chew 5,000 Units by mouth daily. gummy   Past Week   fluticasone (FLONASE) 50 MCG/ACT nasal spray Place 2 sprays into both nostrils daily as needed for allergies or rhinitis.   11/02/2022   loratadine (CLARITIN) 10 MG tablet Take 10 mg by mouth in the morning.   11/02/2022   Melatonin 10 MG TABS Take 10 mg by mouth at bedtime as needed (sleep). chewable   Past Week   Multiple Vitamin (MULTIVITAMIN WITH MINERALS) TABS tablet Take 1 tablet by mouth in the morning. Bariatric vitamin   Past  Week   Multiple Vitamins-Minerals (ZINC PO) Take 50 mg by mouth daily.   Past Week   pantoprazole (PROTONIX) 40 MG tablet Take 1 tablet (40 mg total) by mouth daily. Take this medication daily, regardless of reflux symptoms 90 tablet 0 11/02/2022   Probiotic Product (ALIGN) CHEW Chew 1 tablet by mouth daily. 500 million CFU   11/02/2022   traMADol (ULTRAM) 50 MG tablet Take 1 tablet (50 mg total) by mouth every 6 (six) hours as needed (pain). 10 tablet 0 11/03/2022 at 0815    Review of Systems  All other systems reviewed and are negative.   Blood pressure 136/88, pulse 60, temperature 98.3 F (36.8 C), resp. rate 18, height 5\' 2"  (1.575 m), weight 106.6 kg, SpO2 100 %. Physical Exam Constitutional:      Appearance: Normal appearance.  Cardiovascular:     Rate and Rhythm: Normal rate and regular rhythm.  Pulmonary:     Effort: Pulmonary effort is normal.     Breath sounds: Normal breath sounds.  Neurological:     General: No focal deficit present.     Mental Status: She is alert.  Psychiatric:        Mood and Affect: Mood normal.        Behavior: Behavior normal.        Thought Content: Thought content normal.     No results found for this or any previous visit (from the past 24 hour(s)).  No results found.  Assessment/Plan: 62 yo G3P3 AAF here for hysteroscopy with polyp resection D&C due to PMP bleeding, thickened endometrium and ultrasound findings c/w polyp.  Pt ready to proceed.  Jerene Bears 11/03/2022, 11:12 AM

## 2022-11-03 NOTE — Anesthesia Preprocedure Evaluation (Addendum)
Anesthesia Evaluation  Patient identified by MRN, date of birth, ID band Patient awake    Reviewed: Allergy & Precautions, NPO status , Patient's Chart, lab work & pertinent test results  History of Anesthesia Complications Negative for: history of anesthetic complications  Airway Mallampati: I  TM Distance: >3 FB Neck ROM: Full    Dental  (+) Dental Advisory Given, Teeth Intact   Pulmonary former smoker   Pulmonary exam normal        Cardiovascular hypertension, Pt. on medications Normal cardiovascular exam     Neuro/Psych  PSYCHIATRIC DISORDERS Anxiety      Neuromuscular disease    GI/Hepatic Neg liver ROS,GERD  Medicated and Controlled,, S/p gastric sleeve    Endo/Other    Morbid obesity Pre-DM   Renal/GU negative Renal ROS     Musculoskeletal  (+) Arthritis ,   Ankylosing spondylitis    Abdominal  (+) + obese  Peds  Hematology negative hematology ROS (+)   Anesthesia Other Findings   Reproductive/Obstetrics                             Anesthesia Physical Anesthesia Plan  ASA: 3  Anesthesia Plan: General   Post-op Pain Management: Tylenol PO (pre-op)*   Induction: Intravenous  PONV Risk Score and Plan: 3 and Treatment may vary due to age or medical condition, Ondansetron, Dexamethasone and Midazolam  Airway Management Planned: LMA  Additional Equipment: None  Intra-op Plan:   Post-operative Plan: Extubation in OR  Informed Consent: I have reviewed the patients History and Physical, chart, labs and discussed the procedure including the risks, benefits and alternatives for the proposed anesthesia with the patient or authorized representative who has indicated his/her understanding and acceptance.     Dental advisory given  Plan Discussed with: CRNA and Anesthesiologist  Anesthesia Plan Comments: (No NSAIDs given bariatric surgery )        Anesthesia  Quick Evaluation

## 2022-11-04 ENCOUNTER — Encounter (HOSPITAL_COMMUNITY): Payer: Self-pay | Admitting: Obstetrics & Gynecology

## 2022-11-04 LAB — SURGICAL PATHOLOGY

## 2022-11-17 ENCOUNTER — Ambulatory Visit (INDEPENDENT_AMBULATORY_CARE_PROVIDER_SITE_OTHER): Payer: 59 | Admitting: Adult Health

## 2022-11-17 ENCOUNTER — Encounter (INDEPENDENT_AMBULATORY_CARE_PROVIDER_SITE_OTHER): Payer: Self-pay | Admitting: Adult Health

## 2022-11-17 VITALS — BP 121/79 | HR 78 | Temp 98.1°F | Ht 62.0 in | Wt 234.0 lb

## 2022-11-17 DIAGNOSIS — Z6841 Body Mass Index (BMI) 40.0 and over, adult: Secondary | ICD-10-CM

## 2022-11-17 DIAGNOSIS — Z9884 Bariatric surgery status: Secondary | ICD-10-CM | POA: Diagnosis not present

## 2022-11-17 DIAGNOSIS — M62451 Contracture of muscle, right thigh: Secondary | ICD-10-CM | POA: Diagnosis not present

## 2022-11-17 DIAGNOSIS — M5431 Sciatica, right side: Secondary | ICD-10-CM | POA: Diagnosis not present

## 2022-11-17 DIAGNOSIS — Z9889 Other specified postprocedural states: Secondary | ICD-10-CM

## 2022-11-17 DIAGNOSIS — M9905 Segmental and somatic dysfunction of pelvic region: Secondary | ICD-10-CM | POA: Diagnosis not present

## 2022-11-17 DIAGNOSIS — M9903 Segmental and somatic dysfunction of lumbar region: Secondary | ICD-10-CM | POA: Diagnosis not present

## 2022-11-17 NOTE — Progress Notes (Signed)
WEIGHT SUMMARY AND BIOMETRICS  Vitals Temp: 98.1 F (36.7 C) BP: 121/79 Pulse Rate: 78 SpO2: 98 %   Anthropometric Measurements Height: 5\' 2"  (1.575 m) Weight: 234 lb (106.1 kg) BMI (Calculated): 42.79 Weight at Last Visit: 239lb Weight Lost Since Last Visit: 5lb Weight Gained Since Last Visit: 0 Starting Weight: 280lb Total Weight Loss (lbs): 13 lb (5.897 kg)   Body Composition  Body Fat %: 51.9 % Fat Mass (lbs): 121.8 lbs Muscle Mass (lbs): 107.2 lbs Total Body Water (lbs): 80.4 lbs Visceral Fat Rating : 18   Other Clinical Data Fasting: no Labs: no Today's Visit #: 5 Starting Date: 12/20/19    Chief Complaint:   OBESITY Kelli Simmons is here to discuss her progress with her obesity treatment plan. She is on the keeping a food journal and adhering to recommended goals of 120080 calories and 90 protein and states she is following her eating plan approximately 90 % of the time. She states she is exercising Cardio/Weights 90+ minutes 6 times per week.   Interim History: June 2024- Her adult son- Kelli Simmons (age 1) and her granddaughter - Kelli Simmons (age 8) moved from [place] to [place]. They are living with her and her husband until Kelli Simmons finds gainful employment and can support himself and his daughter. Kelli Simmons will finish his engineering degree May 2025. 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 H/O dilation and curettage 10/31/2022 Procedure Notes [name] is an 62 y.o. female G3P3 AAF with hx of PMP bleeding.  Ultrasound was done and showed possible fundal polyp with uterus measuring 8 x 6 x 4.4cm with 7.69mm endometrium with cystic structure at the fundus.  Office biopsy recommended but pt declined due to prior experience with this.  Desired to have evaluation and treatment done in the operating room.  Pt has similar  procedure done 8/245/2022 and had an endometrial polyp seen at that time as well.    She was scheduled previously for this but ate the morning of the procedure so had to be rescheduled.  Procedure, risks and benefits reviewed.  She desires to proceed and not have any procedures done in the office.   Assessment/Plan: 62 yo G3P3 AAF here for hysteroscopy with polyp resection D&C due to PMP bleeding, thickened endometrium and ultrasound findings c/w polyp.  Pt ready to proceed.  2. S/P laparoscopic sleeve gastrectomy  08/06/2021 Surgical Notes: Weight: (!) 142 kg    Hospital Course:  The patient was admitted for a planned laparoscopic sleeve gastrectomy. Please see operative note. Preoperatively the patient was given 5000 units of subcutaneous heparin for DVT prophylaxis. Postoperative prophylactic Lovenox dosing was started on the evening of postoperative day 0. ERAS protocol was used. On the evening of postoperative day 0, the patient was started on water and ice chips. On postoperative day 1 the patient had no fever or tachycardia and was tolerating water in their diet was gradually advanced throughout the day. The patient was ambulating without difficulty. Their vital signs are stable without fever or tachycardia. Their hemoglobin had remained stable. The patient had received discharge instructions and counseling. They were deemed stable for discharge and had met discharge criteria  Assessment/Plan:   1. H/O dilation and curettage 12/08/2022 Video Visit with OB/GYN- Post Op Follow-Up  2. S/P laparoscopic sleeve gastrectomy Continue Bariatric Multivitamins Continue to increase protein  intake   3. Obesity with current BMI 42.79  [name] is currently in the action stage of change. As such, her goal is to continue with weight loss efforts. She has agreed to keeping a food journal and adhering to recommended goals of 1200 calories and 90 protein.   Exercise goals: For substantial health  benefits, adults should do at least 150 minutes (2 hours and 30 minutes) a week of moderate-intensity, or 75 minutes (1 hour and 15 minutes) a week of vigorous-intensity aerobic physical activity, or an equivalent combination of moderate- and vigorous-intensity aerobic activity. Aerobic activity should be performed in episodes of at least 10 minutes, and preferably, it should be spread throughout the week.  Behavioral modification strategies: increasing lean protein intake, decreasing simple carbohydrates, increasing vegetables, increasing water intake, no skipping meals, meal planning and cooking strategies, keeping healthy foods in the home, better snacking choices, planning for success, and keeping a strict food journal.  Lallie has agreed to follow-up with our clinic in 4 weeks. She was informed of the importance of frequent follow-up visits to maximize her success with intensive lifestyle modifications for her multiple health conditions.   Objective:   Blood pressure 121/79, pulse 78, temperature 98.1 F (36.7 C), height 5\' 2"  (1.575 m), weight 234 lb (106.1 kg), SpO2 98%. Body mass index is 42.8 kg/m.  General: Cooperative, alert, well developed, in no acute distress. HEENT: Conjunctivae and lids unremarkable. Cardiovascular: Regular rhythm.  Lungs: Normal work of breathing. Neurologic: No focal deficits.   Lab Results  Component Value Date   CREATININE 0.80 11/03/2022   BUN 17 11/03/2022   NA 140 11/03/2022   K 4.0 11/03/2022   CL 109 11/03/2022   CO2 24 05/27/2022   Lab Results  Component Value Date   ALT 21 05/27/2022   AST 27 05/27/2022   ALKPHOS 114 05/27/2022   BILITOT 0.3 05/27/2022   Lab Results  Component Value Date   HGBA1C 5.4 05/27/2022   HGBA1C 5.8 (H) 12/23/2020   HGBA1C 5.0 06/12/2020   HGBA1C 5.5 12/20/2019   Lab Results  Component Value Date   INSULIN 7.5 05/27/2022   INSULIN 13.7 12/23/2020   INSULIN 9.7 06/12/2020   INSULIN 14.9 12/20/2019    Lab Results  Component Value Date   TSH 4.120 05/27/2022   Lab Results  Component Value Date   CHOL 207 (H) 05/27/2022   HDL 72 05/27/2022   LDLCALC 119 (H) 05/27/2022   TRIG 93 05/27/2022   CHOLHDL 2.9 12/23/2020   Lab Results  Component Value Date   VD25OH 75.6 05/27/2022   VD25OH 39.7 04/08/2021   VD25OH 40.9 12/23/2020   Lab Results  Component Value Date   WBC 4.0 05/27/2022   HGB 14.3 11/03/2022   HCT 42.0 11/03/2022   MCV 95 05/27/2022   PLT 396 05/27/2022   No results found for: "IRON", "TIBC", "FERRITIN"  Attestation Statements:   Reviewed by clinician on day of visit: allergies, medications, problem list, medical history, surgical history, family history, social history, and previous encounter notes.  Time spent on visit including pre-visit chart review and post-visit care and charting was 27 minutes.   I have reviewed the above documentation for accuracy and completeness, and I agree with the above. - Brandell Maready d. Emilia Kayes, NP-C

## 2022-11-18 ENCOUNTER — Encounter (INDEPENDENT_AMBULATORY_CARE_PROVIDER_SITE_OTHER): Payer: Self-pay

## 2022-12-08 ENCOUNTER — Telehealth (HOSPITAL_BASED_OUTPATIENT_CLINIC_OR_DEPARTMENT_OTHER): Payer: 59 | Admitting: Obstetrics & Gynecology

## 2022-12-08 ENCOUNTER — Encounter (HOSPITAL_BASED_OUTPATIENT_CLINIC_OR_DEPARTMENT_OTHER): Payer: Self-pay | Admitting: Obstetrics & Gynecology

## 2022-12-08 VITALS — BP 121/65 | Wt 229.0 lb

## 2022-12-08 DIAGNOSIS — Z9889 Other specified postprocedural states: Secondary | ICD-10-CM

## 2022-12-08 DIAGNOSIS — N840 Polyp of corpus uteri: Secondary | ICD-10-CM

## 2022-12-08 NOTE — Progress Notes (Unsigned)
Virtual Visit via Telephone Note  I connected with Kelli Simmons on 12/08/22 at  4:15 PM EDT by telephone and verified that I am speaking with the correct person using two identifiers.  Location: Patient: car Provider: office   I discussed the limitations, risks, security and privacy concerns of performing an evaluation and management service by telephone and the availability of in person appointments. I also discussed with the patient that there may be a patient responsible charge related to this service. The patient expressed understanding and agreed to proceed.     History of Present Illness: 62 yo G3P3 MAAF for virtual post op visit.  Could not connect digitally so phone call was made to patient.  Hysteroscopy with polyp resection, D&C performed on 11/03/2022.  Pathology showed benign endometrial polyp and benign endometrial tissue.  Had bleeding for a few days after the procedure and used only Tylenol.  She is not having bleeding at this time.    Her brother just died suddenly.  Will    Observations/Objective: N/a  Assessment and Plan: 1. History of hysteroscopy - pt doing well since procedure  2. Endometrial polyp   Follow Up Instructions: Advised pt to all with any future bleeding or concerns.   I discussed the assessment and treatment plan with the patient. The patient was provided an opportunity to ask questions and all were answered. The patient agreed with the plan and demonstrated an understanding of the instructions.   The patient was advised to call back or seek an in-person evaluation if the symptoms worsen or if the condition fails to improve as anticipated.  I provided 13 minutes of non-face-to-face time during this encounter.   Jerene Bears, MD

## 2022-12-09 DIAGNOSIS — M62451 Contracture of muscle, right thigh: Secondary | ICD-10-CM | POA: Diagnosis not present

## 2022-12-09 DIAGNOSIS — M9905 Segmental and somatic dysfunction of pelvic region: Secondary | ICD-10-CM | POA: Diagnosis not present

## 2022-12-09 DIAGNOSIS — M9903 Segmental and somatic dysfunction of lumbar region: Secondary | ICD-10-CM | POA: Diagnosis not present

## 2022-12-09 DIAGNOSIS — M5431 Sciatica, right side: Secondary | ICD-10-CM | POA: Diagnosis not present

## 2022-12-17 ENCOUNTER — Ambulatory Visit (INDEPENDENT_AMBULATORY_CARE_PROVIDER_SITE_OTHER): Payer: 59 | Admitting: Adult Health

## 2022-12-24 DIAGNOSIS — L309 Dermatitis, unspecified: Secondary | ICD-10-CM | POA: Diagnosis not present

## 2022-12-30 DIAGNOSIS — M9905 Segmental and somatic dysfunction of pelvic region: Secondary | ICD-10-CM | POA: Diagnosis not present

## 2022-12-30 DIAGNOSIS — M9903 Segmental and somatic dysfunction of lumbar region: Secondary | ICD-10-CM | POA: Diagnosis not present

## 2022-12-30 DIAGNOSIS — M5431 Sciatica, right side: Secondary | ICD-10-CM | POA: Diagnosis not present

## 2022-12-30 DIAGNOSIS — M62451 Contracture of muscle, right thigh: Secondary | ICD-10-CM | POA: Diagnosis not present

## 2023-01-12 ENCOUNTER — Ambulatory Visit (INDEPENDENT_AMBULATORY_CARE_PROVIDER_SITE_OTHER): Payer: 59 | Admitting: Adult Health

## 2023-01-13 DIAGNOSIS — M62451 Contracture of muscle, right thigh: Secondary | ICD-10-CM | POA: Diagnosis not present

## 2023-01-13 DIAGNOSIS — M9905 Segmental and somatic dysfunction of pelvic region: Secondary | ICD-10-CM | POA: Diagnosis not present

## 2023-01-13 DIAGNOSIS — M9903 Segmental and somatic dysfunction of lumbar region: Secondary | ICD-10-CM | POA: Diagnosis not present

## 2023-01-27 DIAGNOSIS — M9903 Segmental and somatic dysfunction of lumbar region: Secondary | ICD-10-CM | POA: Diagnosis not present

## 2023-01-27 DIAGNOSIS — M9905 Segmental and somatic dysfunction of pelvic region: Secondary | ICD-10-CM | POA: Diagnosis not present

## 2023-01-27 DIAGNOSIS — M62451 Contracture of muscle, right thigh: Secondary | ICD-10-CM | POA: Diagnosis not present

## 2023-02-05 DIAGNOSIS — Z9884 Bariatric surgery status: Secondary | ICD-10-CM | POA: Diagnosis not present

## 2023-02-10 DIAGNOSIS — M9905 Segmental and somatic dysfunction of pelvic region: Secondary | ICD-10-CM | POA: Diagnosis not present

## 2023-02-10 DIAGNOSIS — M62451 Contracture of muscle, right thigh: Secondary | ICD-10-CM | POA: Diagnosis not present

## 2023-02-10 DIAGNOSIS — M9903 Segmental and somatic dysfunction of lumbar region: Secondary | ICD-10-CM | POA: Diagnosis not present

## 2023-02-24 DIAGNOSIS — M62451 Contracture of muscle, right thigh: Secondary | ICD-10-CM | POA: Diagnosis not present

## 2023-02-24 DIAGNOSIS — M5431 Sciatica, right side: Secondary | ICD-10-CM | POA: Diagnosis not present

## 2023-02-24 DIAGNOSIS — M9905 Segmental and somatic dysfunction of pelvic region: Secondary | ICD-10-CM | POA: Diagnosis not present

## 2023-02-24 DIAGNOSIS — M9903 Segmental and somatic dysfunction of lumbar region: Secondary | ICD-10-CM | POA: Diagnosis not present

## 2023-03-10 DIAGNOSIS — M9903 Segmental and somatic dysfunction of lumbar region: Secondary | ICD-10-CM | POA: Diagnosis not present

## 2023-03-10 DIAGNOSIS — M62451 Contracture of muscle, right thigh: Secondary | ICD-10-CM | POA: Diagnosis not present

## 2023-03-10 DIAGNOSIS — M9905 Segmental and somatic dysfunction of pelvic region: Secondary | ICD-10-CM | POA: Diagnosis not present

## 2023-03-10 DIAGNOSIS — M5431 Sciatica, right side: Secondary | ICD-10-CM | POA: Diagnosis not present

## 2023-03-18 DIAGNOSIS — Z1211 Encounter for screening for malignant neoplasm of colon: Secondary | ICD-10-CM | POA: Diagnosis not present

## 2023-04-14 DIAGNOSIS — M9905 Segmental and somatic dysfunction of pelvic region: Secondary | ICD-10-CM | POA: Diagnosis not present

## 2023-04-14 DIAGNOSIS — M9903 Segmental and somatic dysfunction of lumbar region: Secondary | ICD-10-CM | POA: Diagnosis not present

## 2023-04-14 DIAGNOSIS — M5431 Sciatica, right side: Secondary | ICD-10-CM | POA: Diagnosis not present

## 2023-04-14 DIAGNOSIS — M62451 Contracture of muscle, right thigh: Secondary | ICD-10-CM | POA: Diagnosis not present

## 2023-04-27 DIAGNOSIS — M5431 Sciatica, right side: Secondary | ICD-10-CM | POA: Diagnosis not present

## 2023-04-27 DIAGNOSIS — M9903 Segmental and somatic dysfunction of lumbar region: Secondary | ICD-10-CM | POA: Diagnosis not present

## 2023-04-27 DIAGNOSIS — M9905 Segmental and somatic dysfunction of pelvic region: Secondary | ICD-10-CM | POA: Diagnosis not present

## 2023-04-27 DIAGNOSIS — M62451 Contracture of muscle, right thigh: Secondary | ICD-10-CM | POA: Diagnosis not present

## 2023-06-01 ENCOUNTER — Ambulatory Visit: Payer: Self-pay | Admitting: Family Medicine

## 2023-06-03 DIAGNOSIS — Z0289 Encounter for other administrative examinations: Secondary | ICD-10-CM

## 2023-07-06 ENCOUNTER — Ambulatory Visit (INDEPENDENT_AMBULATORY_CARE_PROVIDER_SITE_OTHER): Payer: Self-pay | Admitting: Adult Health

## 2023-07-28 NOTE — Progress Notes (Signed)
 HPI: Ms.Kelsey Holt is a 63 y.o. female with a PMHx significant for HTN, GERD, neuropathy, HLD, chronic pain, prediabetes, and anxiety, among some, who is here today to establish care.  Former PCP: [name], MD Last preventive routine visit: 03/16/2023  Exercise: Patient states she does cardio exercises and weight training 7 days per week.  Diet: She cooks at home and eats vegetables daily. She says she eats healthy in general.  Sleep: ~8 hours per night.  Alcohol Use: Drinks about 2 glasses of wine per month.  Smoking: She quit smoking in 1990. She smoked for about 15 years.  Vision: UTD on routine vision care.  Dental: UTD on routine dental care.  She follows regularly with gynecology.   Chronic medical problems:   Hypertension:  Medications: Currently on amlodipine 2.5 mg daily.  BP readings at home: She checks her BP regularly at home and says it is normally in the 120s/70s.  Side effects: none Negative for unusual or severe headache, visual changes, exertional chest pain, dyspnea,  focal weakness, or edema.  Lab Results  Component Value Date   CREATININE 0.80 11/03/2022   BUN 17 11/03/2022   NA 140 11/03/2022   K 4.0 11/03/2022   CL 109 11/03/2022   CO2 33 (A) 09/09/2022   Elevated cholesterol: Currently she is on nonpharmacologic treatment. Lab Results  Component Value Date   CHOL 214 (A) 09/09/2022   HDL 147 (A) 09/09/2022   LDLCALC 131 09/09/2022   TRIG 89 09/09/2022   CHOLHDL 2.9 12/23/2020   Chronic pain:  Patient has a hx of chronic knee and back pain for which she is on tramadol 50 mg twice daily. At the worst, she rates her pain as a 6/10.  Endorses occasional bilateral radiations of pain from her back down her legs, with some numbness and tingling.  She sees a Land, and has followed with orthopedics in the past. She is not interested in surgery at this time.   GERD:  Currently on pantoprazole 40 mg daily. She mentions she had a gastric  sleeve procedure in 07/2021.   Also takes Vitamin D 5000 units with calcium and a multivitamin daily.   Concerns today:   Seasonal allergies:  Patient has a hx of seasonal allergies, for which she uses Azelastine and Flonase and takes Claritin 10 mg daily. She tried Singulair about 7 years ago, but cannot remember if it worked well.   She says her symptoms are flaring up currently and she doesn't believe her medications are working.  She has seen an allergist in the past, but hasn't followed with them in a while due to insurance reasons.   Currently, she has been having sneezing, coughing, and popping in her ears. Also had some fever (101.7) and chills this morning and over the last 5 days, but they improved with tylenol.  No known sick contacts.   Review of Systems  Constitutional:  Positive for fatigue. Negative for activity change, appetite change and fever.  HENT:  Positive for congestion, postnasal drip, rhinorrhea and sinus pain. Negative for mouth sores, nosebleeds, sore throat and trouble swallowing.   Respiratory:  Negative for wheezing and stridor.   Gastrointestinal:  Negative for abdominal pain, nausea and vomiting.  Genitourinary:  Negative for decreased urine volume, dysuria and hematuria.  Musculoskeletal:  Positive for arthralgias and back pain. Negative for gait problem.  Skin:  Negative for rash.  Allergic/Immunologic: Positive for environmental allergies.  Neurological:  Positive for headaches. Negative for syncope  and facial asymmetry.  Psychiatric/Behavioral:  Negative for confusion and hallucinations.   See other pertinent positives and negatives in HPI.  Current Outpatient Medications on File Prior to Visit  Medication Sig Dispense Refill   acetaminophen (TYLENOL) 500 MG tablet Take 500 mg by mouth every 6 (six) hours as needed for moderate pain or mild pain (Knee and back).     amLODipine (NORVASC) 2.5 MG tablet Take 2.5 mg by mouth daily.     azelastine  (ASTELIN) 0.1 % nasal spray Place into both nostrils as needed for rhinitis or allergies. Use in each nostril as directed     calcium carbonate (OS-CAL) 1250 (500 Ca) MG chewable tablet Chew 1 tablet by mouth daily.     Cholecalciferol (VITAMIN D3) 125 MCG (5000 UT) CHEW Chew 5,000 Units by mouth daily. gummy     fluticasone (FLONASE) 50 MCG/ACT nasal spray Place 2 sprays into both nostrils daily as needed for allergies or rhinitis.     loratadine (CLARITIN) 10 MG tablet Take 10 mg by mouth in the morning.     Multiple Vitamin (MULTIVITAMIN WITH MINERALS) TABS tablet Take 1 tablet by mouth in the morning. Bariatric vitamin     Multiple Vitamins-Minerals (ZINC PO) Take 50 mg by mouth daily.     pantoprazole (PROTONIX) 40 MG tablet Take 1 tablet (40 mg total) by mouth daily. Take this medication daily, regardless of reflux symptoms 90 tablet 0   Probiotic Product (ALIGN) CHEW Chew 1 tablet by mouth daily. 500 million CFU     traMADol (ULTRAM) 50 MG tablet Take 1 tablet (50 mg total) by mouth every 6 (six) hours as needed (pain). 10 tablet 0   Melatonin 10 MG TABS Take 10 mg by mouth at bedtime as needed (sleep). chewable (Patient not taking: Reported on 07/30/2023)     No current facility-administered medications on file prior to visit.   Past Medical History:  Diagnosis Date   Ankylosing spondylitis (HCC)    spine   Back pain    BMI 50.0-59.9, adult (HCC)    GERD (gastroesophageal reflux disease)    Hypertension    Joint pain    Low back pain    Osteoarthritis    knees   Allergies  Allergen Reactions   Soy Allergy (Obsolete) Other (See Comments)    clusters of cysts   Family History  Problem Relation Age of Onset   Hypertension Mother    Hyperlipidemia Mother    Thyroid disease Mother    Depression Mother    Anxiety disorder Mother    Schizophrenia Mother    Obesity Mother    Heart disease Mother    Eating disorder Mother    Sudden death Father    Asthma Neg Hx    Eczema Neg  Hx    Allergic Disorder Neg Hx    Social History   Socioeconomic History   Marital status: Married    Spouse name: Not on file   Number of children: Not on file   Years of education: Not on file   Highest education level: Not on file  Occupational History   Occupation: Stay at Home Spouse  Tobacco Use   Smoking status: Former    Current packs/day: 0.00    Average packs/day: 2.0 packs/day for 16.0 years (32.0 ttl pk-yrs)    Types: Cigarettes    Start date: 04/27/1972    Quit date: 04/27/1988    Years since quitting: 35.2   Smokeless tobacco: Never  Vaping  Use   Vaping status: Never Used  Substance and Sexual Activity   Alcohol use: Yes    Comment: rare   Drug use: Never   Sexual activity: Not on file  Other Topics Concern   Not on file  Social History Narrative   Not on file   Social Drivers of Health   Financial Resource Strain: Not on file  Food Insecurity: Not on file  Transportation Needs: Not on file  Physical Activity: Not on file  Stress: Not on file  Social Connections: Unknown (09/09/2021)   Received from [facility]   Social Network    Social Network: Not on file   Vitals:   07/30/23 0833  BP: 138/82  Pulse: 75  Resp: 16  Temp: 98.3 F (36.8 C)  SpO2: 97%    Body mass index is 44.34 kg/m.  Physical Exam Vitals and nursing note reviewed.  Constitutional:      General: She is not in acute distress.    Appearance: She is well-developed.  HENT:     Head: Normocephalic and atraumatic.     Right Ear: Tympanic membrane, ear canal and external ear normal.     Left Ear: Tympanic membrane, ear canal and external ear normal.     Nose:     Right Turbinates: Enlarged.     Left Turbinates: Enlarged.     Right Sinus: Maxillary sinus tenderness and frontal sinus tenderness present.     Left Sinus: Maxillary sinus tenderness and frontal sinus tenderness present.     Mouth/Throat:     Mouth: Mucous membranes are moist.     Pharynx:  Oropharynx is clear.  Eyes:     Conjunctiva/sclera: Conjunctivae normal.     Comments: Exophoria, bilateral.  Cardiovascular:     Rate and Rhythm: Normal rate and regular rhythm.     Pulses:          Dorsalis pedis pulses are 2+ on the right side and 2+ on the left side.     Heart sounds: No murmur heard. Pulmonary:     Effort: Pulmonary effort is normal. No respiratory distress.     Breath sounds: Normal breath sounds.  Abdominal:     Palpations: Abdomen is soft. There is no hepatomegaly or mass.     Tenderness: There is no abdominal tenderness.  Musculoskeletal:     Lumbar back: No tenderness or bony tenderness. Negative right straight leg raise test and negative left straight leg raise test.     Right knee: Crepitus present.     Left knee: Crepitus present.     Right lower leg: No edema.     Left lower leg: No edema.  Lymphadenopathy:     Cervical: No cervical adenopathy.  Skin:    General: Skin is warm.     Findings: No erythema or rash.  Neurological:     General: No focal deficit present.     Mental Status: She is alert and oriented to person, place, and time.     Cranial Nerves: No cranial nerve deficit.     Comments: Antalgic gait, not assisted.  Psychiatric:        Mood and Affect: Mood and affect normal.   ASSESSMENT AND PLAN:  Ms. Cyphers was seen today to establish care.   Sinus pain I do not think antibiotic treatment is needed at this time, most likely related to seasonal allergies, reports having similar symptoms last year around this time of the year. I sent  a prescription for Augmentin in case pain gets worse after 4 to 5 days, we discussed son side effects of antibiotic use. Continue treatment for allergic rhinitis. Follow-up as needed.  Other orders -     Amoxicillin-Pot Clavulanate; Take 1 tablet by mouth 2 (two) times daily for 7 days.  Dispense: 14 tablet; Refill: 0  Hyperlipidemia, unspecified hyperlipidemia type Assessment & Plan: Continue  nonpharmacologic treatment. Will plan on repeating fasting lipid panel in 02/2024.  Seasonal allergic rhinitis due to pollen Assessment & Plan: Problem is not well-controlled. In the past she has been evaluated by immunologist, immunotherapy was recommended but not done due to cost. Recommend continuing with Flonase nasal spray, Astelin nasal spray, loratadine, and nasal saline irrigations. Today Singulair 10 mg daily added, if it helps, she can continue taking it every year around spring time. New referral for immunologist placed.  Orders: -     Montelukast Sodium; Take 1 tablet (10 mg total) by mouth at bedtime.  Dispense: 30 tablet; Refill: 3  Essential hypertension Assessment & Plan: BP today mildly elevated, reporting lower BPs at home, 120s/70s. Continue amlodipine 2.5 mg daily and low-salt diet. Follow-up in 3 months, before if needed.  Orders: -     Ambulatory referral to Immunology  Gastroesophageal reflux disease, unspecified whether esophagitis present Assessment & Plan: Problem is currently controlled. Continue pantoprazole 40 mg daily and GERD precautions.  Chronic bilateral low back pain, unspecified whether sciatica present Assessment & Plan: Occasionally radiates to lower extremities. This is a chronic problem, currently she is on tramadol 50 mg twice daily, which is helping with pain. No changes to current management. Continue regular physical activity, low impact.  Chronic pain disorder Assessment & Plan: Chronic lower back and knee pain. On chronic tramadol use, she is currently on 50 mg twice daily. PDMP reviewed. We will plan on signing medication contract next visit.  Return in about 3 months (around 10/29/2023) for chronic problems.  I, Rolla Etienne Wierda, acting as a scribe for Decker Cogdell [place], MD., have documented all relevant documentation on the behalf of Addaleigh Nicholls [place], MD, as directed by  Briarrose Shor [place], MD while in the presence of Tahani Potier [place], MD.    I, Mikena Masoner [place], MD, have reviewed all documentation for this visit. The documentation on 07/30/23 for the exam, diagnosis, procedures, and orders are all accurate and complete.  Anacristina Steffek G. [place], MD  [facility]. [place] office.

## 2023-07-30 ENCOUNTER — Ambulatory Visit: Payer: Self-pay | Admitting: Family Medicine

## 2023-07-30 ENCOUNTER — Encounter: Payer: Self-pay | Admitting: Family Medicine

## 2023-07-30 VITALS — BP 138/82 | HR 75 | Temp 98.3°F | Resp 16 | Ht 62.0 in | Wt 242.4 lb

## 2023-07-30 DIAGNOSIS — K219 Gastro-esophageal reflux disease without esophagitis: Secondary | ICD-10-CM

## 2023-07-30 DIAGNOSIS — E785 Hyperlipidemia, unspecified: Secondary | ICD-10-CM | POA: Diagnosis not present

## 2023-07-30 DIAGNOSIS — J3489 Other specified disorders of nose and nasal sinuses: Secondary | ICD-10-CM | POA: Diagnosis not present

## 2023-07-30 DIAGNOSIS — M545 Low back pain, unspecified: Secondary | ICD-10-CM

## 2023-07-30 DIAGNOSIS — I10 Essential (primary) hypertension: Secondary | ICD-10-CM | POA: Diagnosis not present

## 2023-07-30 DIAGNOSIS — J301 Allergic rhinitis due to pollen: Secondary | ICD-10-CM | POA: Diagnosis not present

## 2023-07-30 DIAGNOSIS — G894 Chronic pain syndrome: Secondary | ICD-10-CM

## 2023-07-30 MED ORDER — MONTELUKAST SODIUM 10 MG PO TABS
10.0000 mg | ORAL_TABLET | Freq: Every day | ORAL | 3 refills | Status: AC
Start: 2023-07-30 — End: ?

## 2023-07-30 MED ORDER — AMOXICILLIN-POT CLAVULANATE 875-125 MG PO TABS: 1.0000 | ORAL_TABLET | Freq: Two times a day (BID) | ORAL | 0 refills | Status: AC

## 2023-07-30 NOTE — Assessment & Plan Note (Addendum)
 Occasionally radiates to lower extremities. This is a chronic problem, currently she is on tramadol 50 mg twice daily, which is helping with pain. No changes to current management. Continue regular physical activity, low impact.

## 2023-07-30 NOTE — Patient Instructions (Addendum)
 A few things to remember from today's visit:  Hyperlipidemia, unspecified hyperlipidemia type  Seasonal allergic rhinitis due to pollen - Plan: montelukast (SINGULAIR) 10 MG tablet  Essential hypertension - Plan: Ambulatory referral to Immunology  Gastroesophageal reflux disease, unspecified whether esophagitis present  Chronic bilateral low back pain, unspecified whether sciatica present  Sinus pain I do not think antibiotic is needed at this time, you can start it if pain gets worse after 4-5 days. Singulair 10 mg at night added today. Continue rest of treatment for allergies.  If you need refills for medications you take chronically, please call your pharmacy. Do not use My Chart to request refills or for acute issues that need immediate attention. If you send a my chart message, it may take a few days to be addressed, specially if I am not in the office.  Please be sure medication list is accurate. If a new problem present, please set up appointment sooner than planned today.

## 2023-07-30 NOTE — Assessment & Plan Note (Signed)
 BP today mildly elevated, reporting lower BPs at home, 120s/70s. Continue amlodipine 2.5 mg daily and low-salt diet. Follow-up in 3 months, before if needed.

## 2023-07-30 NOTE — Assessment & Plan Note (Signed)
 Problem is currently controlled. Continue pantoprazole 40 mg daily and GERD precautions.

## 2023-07-30 NOTE — Assessment & Plan Note (Signed)
 Chronic lower back and knee pain. On chronic tramadol use, she is currently on 50 mg twice daily. PDMP reviewed. We will plan on signing medication contract next visit.

## 2023-07-30 NOTE — Assessment & Plan Note (Signed)
 Continue nonpharmacologic treatment. Will plan on repeating fasting lipid panel in 02/2024.

## 2023-07-30 NOTE — Assessment & Plan Note (Signed)
 Problem is not well-controlled. In the past she has been evaluated by immunologist, immunotherapy was recommended but not done due to cost. Recommend continuing with Flonase nasal spray, Astelin nasal spray, loratadine, and nasal saline irrigations. Today Singulair 10 mg daily added, if it helps, she can continue taking it every year around spring time. New referral for immunologist placed.

## 2023-08-23 ENCOUNTER — Ambulatory Visit (INDEPENDENT_AMBULATORY_CARE_PROVIDER_SITE_OTHER): Payer: Self-pay | Admitting: Adult Health

## 2023-09-13 ENCOUNTER — Other Ambulatory Visit (HOSPITAL_BASED_OUTPATIENT_CLINIC_OR_DEPARTMENT_OTHER): Payer: Self-pay | Admitting: Family Medicine

## 2023-09-13 DIAGNOSIS — Z1231 Encounter for screening mammogram for malignant neoplasm of breast: Secondary | ICD-10-CM

## 2023-09-23 ENCOUNTER — Ambulatory Visit (INDEPENDENT_AMBULATORY_CARE_PROVIDER_SITE_OTHER): Payer: Self-pay | Admitting: Adult Health

## 2023-10-01 ENCOUNTER — Encounter (HOSPITAL_BASED_OUTPATIENT_CLINIC_OR_DEPARTMENT_OTHER): Payer: Self-pay | Admitting: Radiology

## 2023-10-01 ENCOUNTER — Ambulatory Visit (HOSPITAL_BASED_OUTPATIENT_CLINIC_OR_DEPARTMENT_OTHER)
Admission: RE | Admit: 2023-10-01 | Discharge: 2023-10-01 | Disposition: A | Discharge status: Home / Self Care | Source: Ambulatory Visit | Attending: Family Medicine | Admitting: Family Medicine

## 2023-10-01 DIAGNOSIS — Z1231 Encounter for screening mammogram for malignant neoplasm of breast: Secondary | ICD-10-CM | POA: Insufficient documentation

## 2023-11-01 ENCOUNTER — Ambulatory Visit: Admitting: Family Medicine

## 2023-11-11 ENCOUNTER — Encounter (INDEPENDENT_AMBULATORY_CARE_PROVIDER_SITE_OTHER): Payer: Self-pay | Admitting: Adult Health

## 2023-11-11 ENCOUNTER — Ambulatory Visit (INDEPENDENT_AMBULATORY_CARE_PROVIDER_SITE_OTHER): Payer: Self-pay | Admitting: Adult Health

## 2023-11-11 VITALS — BP 130/76 | HR 59 | Temp 98.5°F | Ht 62.0 in | Wt 238.0 lb

## 2023-11-11 DIAGNOSIS — E66813 Obesity, class 3: Secondary | ICD-10-CM

## 2023-11-11 DIAGNOSIS — Z6841 Body Mass Index (BMI) 40.0 and over, adult: Secondary | ICD-10-CM

## 2023-11-11 DIAGNOSIS — R7303 Prediabetes: Secondary | ICD-10-CM | POA: Diagnosis not present

## 2023-11-11 DIAGNOSIS — E669 Obesity, unspecified: Secondary | ICD-10-CM

## 2023-11-11 DIAGNOSIS — K219 Gastro-esophageal reflux disease without esophagitis: Secondary | ICD-10-CM

## 2023-11-11 DIAGNOSIS — Z0000 Encounter for general adult medical examination without abnormal findings: Secondary | ICD-10-CM

## 2023-11-11 DIAGNOSIS — Z9884 Bariatric surgery status: Secondary | ICD-10-CM

## 2023-11-11 DIAGNOSIS — E559 Vitamin D deficiency, unspecified: Secondary | ICD-10-CM

## 2023-11-11 DIAGNOSIS — I10 Essential (primary) hypertension: Secondary | ICD-10-CM

## 2023-11-11 NOTE — Progress Notes (Addendum)
 WEIGHT SUMMARY AND BIOMETRICS  Vitals Temp: 98.5 F (36.9 C) BP: 130/76 Pulse Rate: (!) 59 SpO2: 93 %   Anthropometric Measurements Height: 5' 2 (1.575 m) Weight: 238 lb (108 kg) BMI (Calculated): 43.52 Weight at Last Visit: 234 lb Weight Lost Since Last Visit: 0 Weight Gained Since Last Visit: 4 lb Starting Weight: 280 lb Total Weight Loss (lbs): 9 lb (4.082 kg)   Body Composition  Body Fat %: 50.8 % Fat Mass (lbs): 121 lbs Muscle Mass (lbs): 111.2 lbs Total Body Water (lbs): 80 lbs Visceral Fat Rating : 18   Other Clinical Data Fasting: yes Labs: no Today's Visit #: 6 Starting Date: 12/20/19    Chief Complaint:   OBESITY Kelli Simmons is here to discuss her progress with her obesity treatment plan.  She is on the keeping a food journal and adhering to recommended goals of 1200 calories and 90g+ protein and states she is following her eating plan approximately 80 % of the time.  She states she is exercising Strength Training/Cardiovascular 90-120 minutes 7 times per week.  Interim History:  Last OV at [facility] was 11/17/2022. The following day her brother suddenly passed away. She has continued to exercise consistently, however she was lost to f/u at [facility]. Her son and her granddaughter have moved in with she and her husband. Her son graduated from his technical program and her granddaughter will matriculate into [employer]  Her husband is fully retired and is enjoying it! He has started exercising with Mrs. Huyser at [facility]  Reviewed Bioimpedance Results with pt: Muscle Mass: +4 lbs Adipose Mass: -0.8 lb  Subjective:   1. 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 Please see operative note. Preoperatively the patient was given 5000 units of subcutaneous heparin  for DVT prophylaxis. Postoperative prophylactic  Lovenox  dosing was started on the evening of postoperative day 0. ERAS protocol was used. On the evening of postoperative day 0, the patient was started on water and ice chips. On postoperative day 1 the patient had no fever or tachycardia and was tolerating water in their diet was gradually advanced throughout the day. The patient was ambulating without difficulty. Their vital signs are stable without fever or tachycardia. Their hemoglobin had remained stable. The patient had received discharge instructions and counseling. They were deemed stable for discharge and had met discharge criteria  Current weight today: 238 lbs  2. Essential hypertension BP recheck is improved from initial reading. She is able to exercise at high intensity without CP or dyspnea She is currently on Amlodipine  2.5mg , managed by PCP  3. Gastroesophageal reflux disease, unspecified whether esophagitis present She is on daily Protonix  40mg   4. Healthcare Mx Lipid Panel     Component Value Date/Time   CHOL 226 (H) 11/11/2023 1242   TRIG 83 11/11/2023 1242   HDL 86 11/11/2023 1242   CHOLHDL 2.6 11/11/2023 1242   LDLCALC 126 (H) 11/11/2023 1242   LABVLDL 14 11/11/2023 1242   The ASCVD Risk score (Arnett DK, et al., 2019) failed to calculate for the following reasons:   Unable to determine if patient is Non-Hispanic African American   She reports stable energy levels  5. Vit D Def  Latest Reference Range & Units 05/27/22 09:43 09/09/22 13:25  Vitamin D , 25-Hydroxy 30.0 - 100.0 ng/mL 75.6 74.9 (E)  (E): External lab result  She reports  stable energy levels  6. Prediabetes   Latest Reference Range & Units 05/27/22 09:43 09/09/22 13:25  Hemoglobin A1C 4.8 - 5.6 % 5.4 5.3 (E)  (E): External lab result  She is not currently on any antidiabetic medications at this time  Assessment/Plan:   1. S/P laparoscopic sleeve gastrectomy Check Labs  2. Essential hypertension (Primary) Check Labs -CMP  3.  Gastroesophageal reflux disease, unspecified whether esophagitis present Avoid known trigger foods Continue regular exercise  4. Healthcare Mx Check Labs -TSH, Free T4 - B12 -Lipid  5. Vit D Def Check Labs -Vit D   6. Prediabetes  Check Labs -A1c -Insulin  Level  5. Obesity with current BMI 43.6  [name] is currently in the action stage of change. As such, her goal is to continue with weight loss efforts. She has agreed to keeping a food journal and adhering to recommended goals of 1200 calories and 90g+ protein.   Exercise goals: For substantial health benefits, adults should do at least 150 minutes (2 hours and 30 minutes) a week of moderate-intensity, or 75 minutes (1 hour and 15 minutes) a week of vigorous-intensity aerobic physical activity, or an equivalent combination of moderate- and vigorous-intensity aerobic activity. Aerobic activity should be performed in episodes of at least 10 minutes, and preferably, it should be spread throughout the week.  Behavioral modification strategies: increasing lean protein intake, decreasing simple carbohydrates, increasing vegetables, increasing water intake, no skipping meals, meal planning and cooking strategies, keeping healthy foods in the home, and planning for success.  Lorelle has agreed to follow-up with our clinic in 4 weeks. She was informed of the importance of frequent follow-up visits to maximize her success with intensive lifestyle modifications for her multiple health conditions.   Check IC at next OV- pt aware to arrive 30 mins and to be fasting  Objective:   Blood pressure 130/76, pulse (!) 59, temperature 98.5 F (36.9 C), height 5' 2 (1.575 m), weight 238 lb (108 kg), SpO2 93%. Body mass index is 43.53 kg/m.  General: Cooperative, alert, well developed, in no acute distress. HEENT: Conjunctivae and lids unremarkable. Cardiovascular: Regular rhythm.  Lungs: Normal work of breathing. Neurologic: No focal deficits.    Lab Results  Component Value Date   CREATININE 0.80 11/03/2022   BUN 17 11/03/2022   NA 140 11/03/2022   K 4.0 11/03/2022   CL 109 11/03/2022   CO2 33 (A) 09/09/2022   Lab Results  Component Value Date   ALT 19 09/09/2022   AST 23 09/09/2022   ALKPHOS 114 09/09/2022   BILITOT 0.3 05/27/2022   Lab Results  Component Value Date   HGBA1C 5.3 09/09/2022   HGBA1C 5.4 05/27/2022   HGBA1C 5.8 (H) 12/23/2020   HGBA1C 5.0 06/12/2020   HGBA1C 5.5 12/20/2019   Lab Results  Component Value Date   INSULIN  7.5 05/27/2022   INSULIN  13.7 12/23/2020   INSULIN  9.7 06/12/2020   INSULIN  14.9 12/20/2019   Lab Results  Component Value Date   TSH 4.120 05/27/2022   Lab Results  Component Value Date   CHOL 214 (A) 09/09/2022   HDL 147 (A) 09/09/2022   LDLCALC 131 09/09/2022   TRIG 89 09/09/2022   CHOLHDL 2.9 12/23/2020   Lab Results  Component Value Date   VD25OH 74.9 09/09/2022   VD25OH 75.6 05/27/2022   VD25OH 39.7 04/08/2021   Lab Results  Component Value Date   WBC 4.2 09/09/2022   HGB 14.3 11/03/2022   HCT 42.0 11/03/2022  MCV 95 05/27/2022   PLT 396 05/27/2022   No results found for: IRON, TIBC, FERRITIN  Attestation Statements:   Reviewed by clinician on day of visit: allergies, medications, problem list, medical history, surgical history, family history, social history, and previous encounter notes.  I have reviewed the above documentation for accuracy and completeness, and I agree with the above. -  Sherronda Sweigert d. Johnell Landowski, NP-C

## 2023-11-12 LAB — LIPID PANEL
Chol/HDL Ratio: 2.6 ratio (ref 0.0–4.4)
Cholesterol, Total: 226 mg/dL — ABNORMAL HIGH (ref 100–199)
HDL: 86 mg/dL (ref >39)
LDL Chol Calc (NIH): 126 mg/dL — ABNORMAL HIGH (ref 0–99)
Triglycerides: 83 mg/dL (ref 0–149)
VLDL Cholesterol Cal: 14 mg/dL (ref 5–40)

## 2023-11-12 LAB — COMPREHENSIVE METABOLIC PANEL WITH GFR
ALT: 23 IU/L (ref 0–32)
AST: 30 IU/L (ref 0–40)
Albumin: 4.2 g/dL (ref 3.9–4.9)
Alkaline Phosphatase: 124 IU/L — ABNORMAL HIGH (ref 44–121)
BUN/Creatinine Ratio: 18 (ref 12–28)
BUN: 13 mg/dL (ref 8–27)
Bilirubin Total: 0.4 mg/dL (ref 0.0–1.2)
CO2: 22 mmol/L (ref 20–29)
Calcium: 9.8 mg/dL (ref 8.7–10.3)
Chloride: 103 mmol/L (ref 96–106)
Creatinine, Ser: 0.74 mg/dL (ref 0.57–1.00)
Globulin, Total: 3 g/dL (ref 1.5–4.5)
Glucose: 89 mg/dL (ref 70–99)
Potassium: 4.2 mmol/L (ref 3.5–5.2)
Sodium: 141 mmol/L (ref 134–144)
Total Protein: 7.2 g/dL (ref 6.0–8.5)
eGFR: 91 mL/min/1.73 (ref >59)

## 2023-11-12 LAB — TSH+FREE T4
Free T4: 1.08 ng/dL (ref 0.82–1.77)
TSH: 2.49 u[IU]/mL (ref 0.450–4.500)

## 2023-11-12 LAB — HEMOGLOBIN A1C
Est. average glucose Bld gHb Est-mCnc: 100 mg/dL
Hgb A1c MFr Bld: 5.1 % (ref 4.8–5.6)

## 2023-11-12 LAB — VITAMIN B12: Vitamin B-12: >2000 pg/mL — ABNORMAL HIGH (ref 232–1245)

## 2023-11-12 LAB — INSULIN, RANDOM: INSULIN: 5.8 u[IU]/mL (ref 2.6–24.9)

## 2023-11-12 LAB — VITAMIN D 25 HYDROXY (VIT D DEFICIENCY, FRACTURES): Vit D, 25-Hydroxy: 40.6 ng/mL (ref 30.0–100.0)

## 2023-11-25 IMAGING — MG MM DIGITAL SCREENING BILAT W/ TOMO AND CAD
8 of 14 series · 8 of 40 positions shown · non-contrast
Comparison: Previous exam(s).

ACR Breast Density Category a: The breast tissue is almost entirely
fatty.

CLINICAL DATA: Screening.

EXAM:
DIGITAL SCREENING BILATERAL MAMMOGRAM WITH TOMOSYNTHESIS AND CAD
TECHNIQUE: Bilateral screening digital craniocaudal and mediolateral oblique
mammograms were obtained. Bilateral screening digital breast
tomosynthesis was performed. The images were evaluated with
computer-aided detection.

[L MLO synth-2D (1 of 3)]
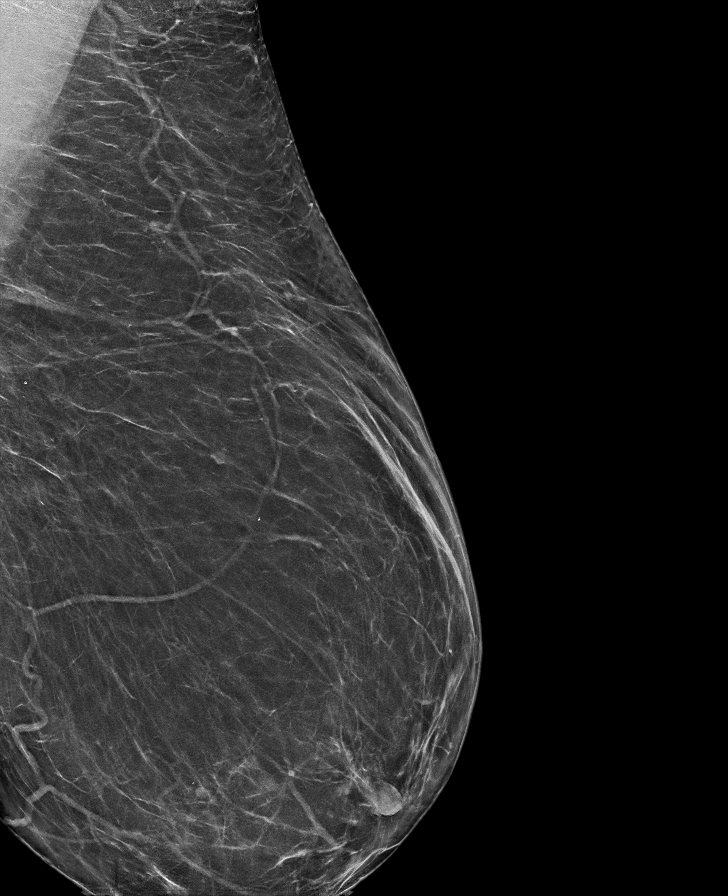

[L CC synth-2D]
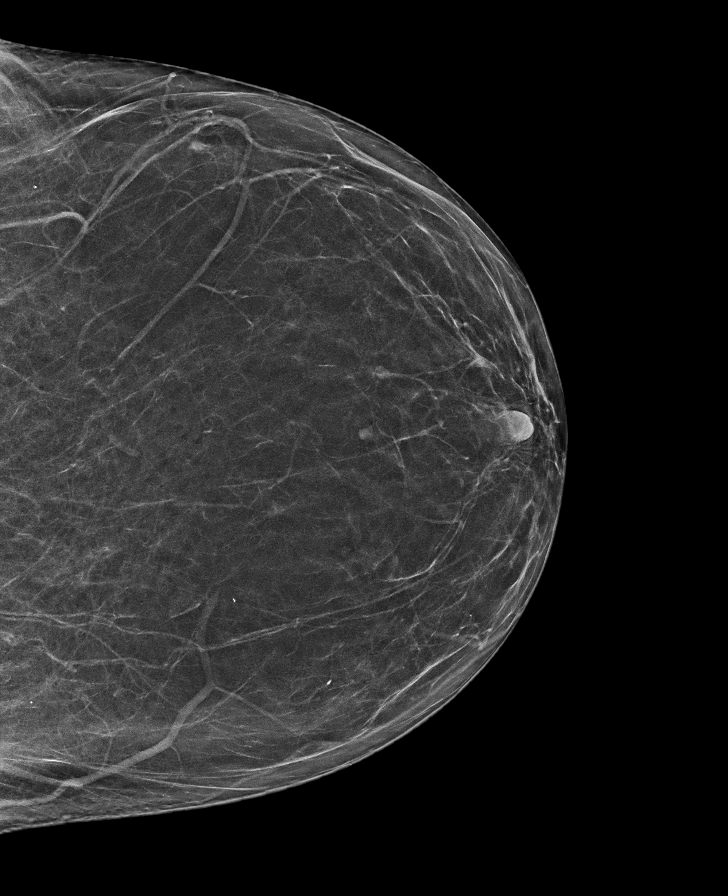

[L MLO synth-2D (2 of 3)]
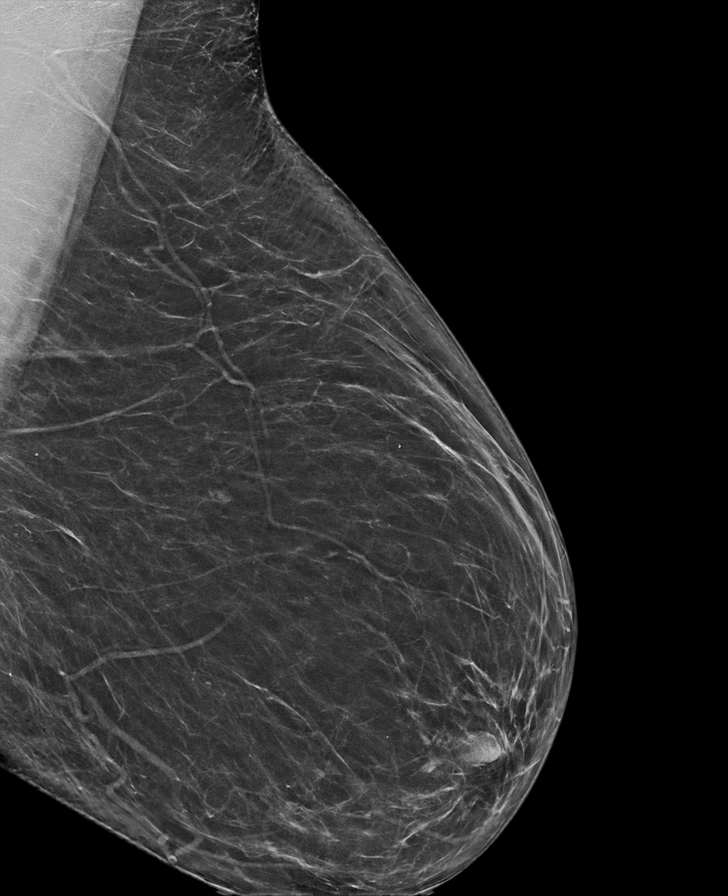

[R MLO synth-2D (1 of 2)]
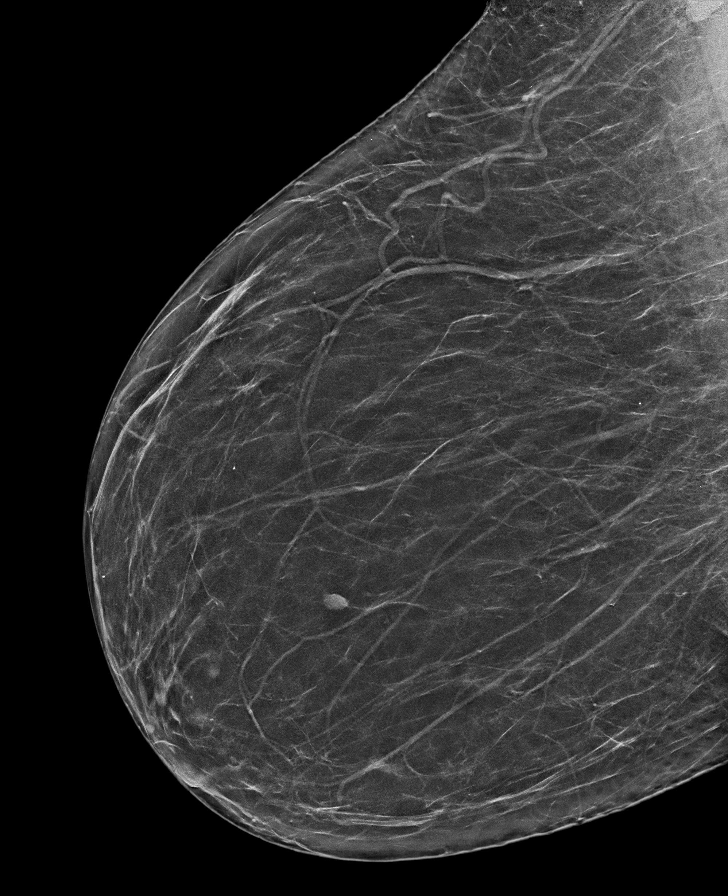

[R CC synth-2D]
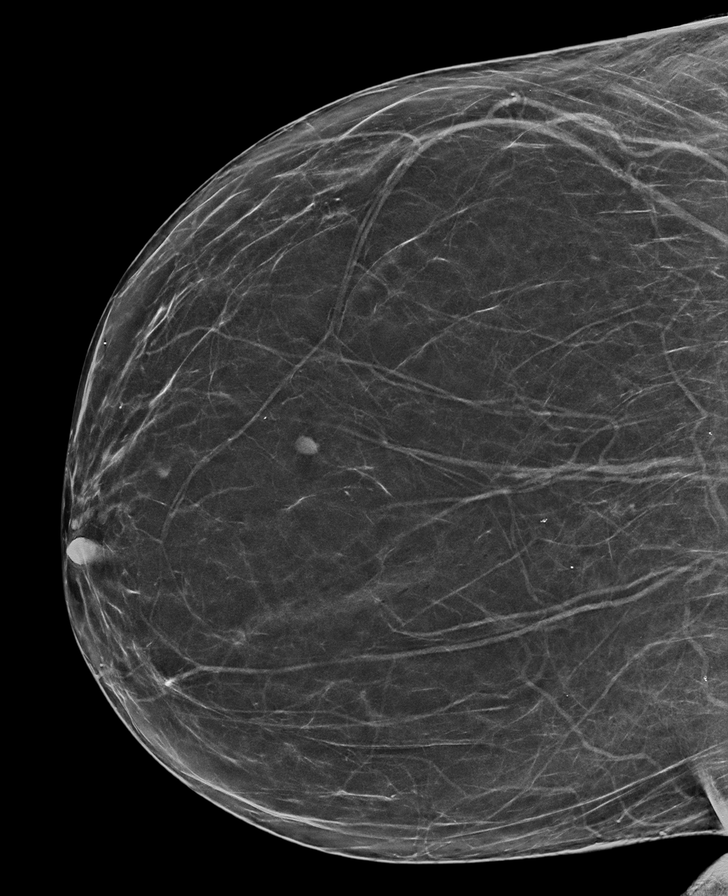

[L MLO synth-2D (3 of 3)]
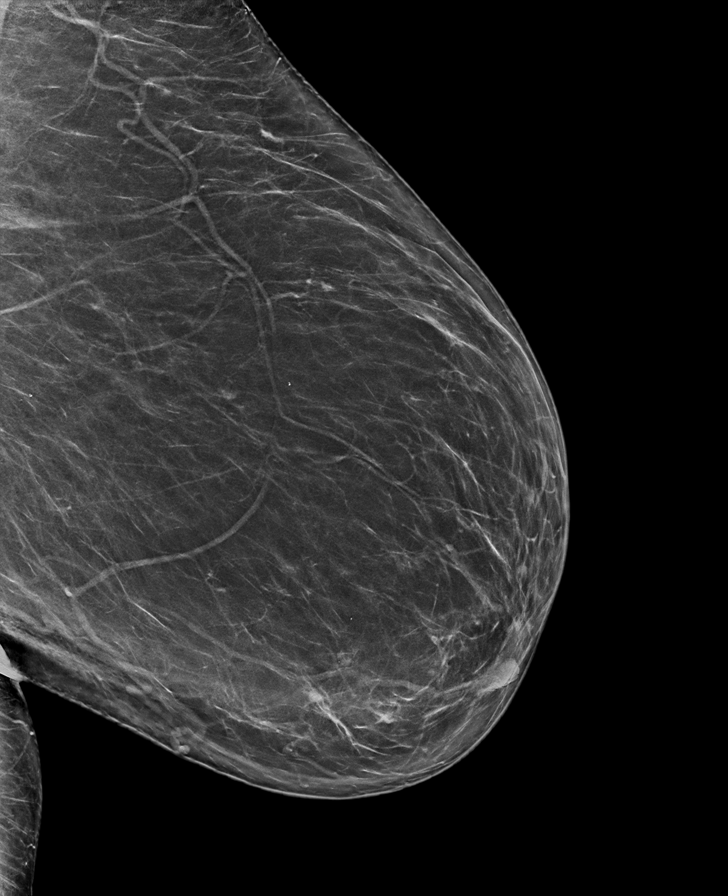

[R MLO synth-2D (2 of 2)]
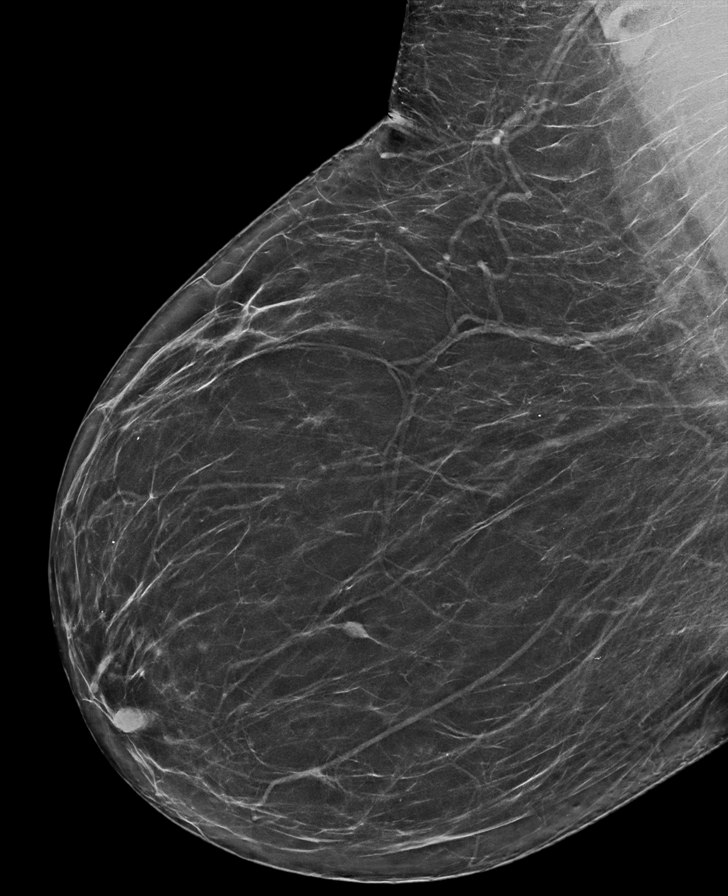

[L MLO tomo · tomo slice 40/79.0]
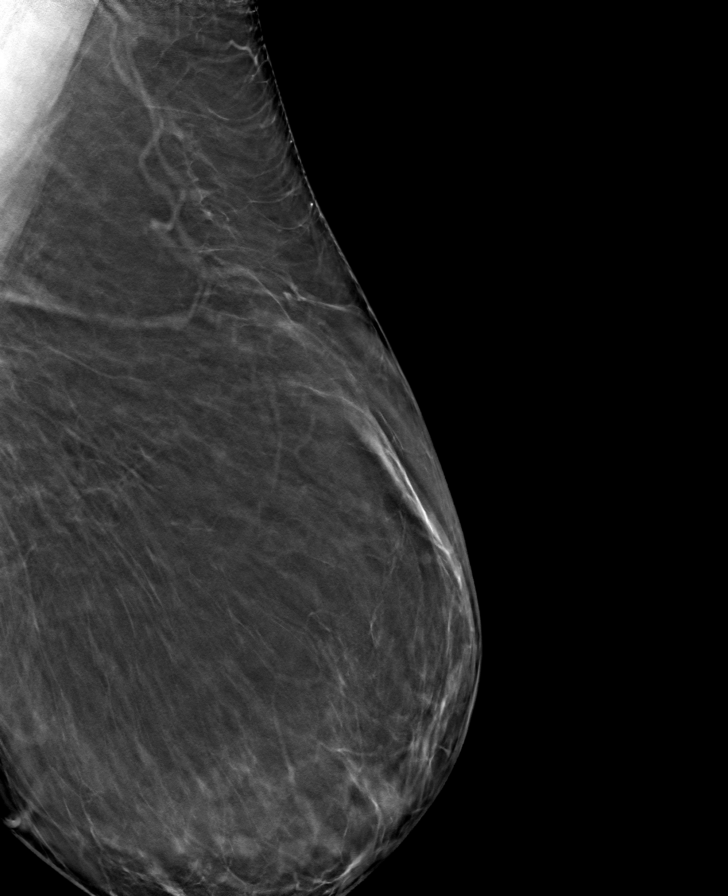

[8 of 40 positions shown; findings below may reference images not displayed]

FINDINGS: There are no findings suspicious for malignancy.
IMPRESSION: No mammographic evidence of malignancy. A result letter of this
screening mammogram will be mailed directly to the patient.

RECOMMENDATION:
Screening mammogram in one year. (Code:0E-3-N98)

BI-RADS CATEGORY  1: Negative.

## 2023-12-22 ENCOUNTER — Ambulatory Visit (INDEPENDENT_AMBULATORY_CARE_PROVIDER_SITE_OTHER): Admitting: Adult Health

## 2024-01-17 ENCOUNTER — Ambulatory Visit (INDEPENDENT_AMBULATORY_CARE_PROVIDER_SITE_OTHER): Admitting: Adult Health

## 2024-01-17 ENCOUNTER — Encounter (INDEPENDENT_AMBULATORY_CARE_PROVIDER_SITE_OTHER): Payer: Self-pay | Admitting: Adult Health

## 2024-01-17 VITALS — BP 133/78 | HR 74 | Temp 98.8°F | Ht 62.0 in | Wt 237.0 lb

## 2024-01-17 DIAGNOSIS — E66813 Obesity, class 3: Secondary | ICD-10-CM

## 2024-01-17 DIAGNOSIS — K219 Gastro-esophageal reflux disease without esophagitis: Secondary | ICD-10-CM

## 2024-01-17 DIAGNOSIS — R0602 Shortness of breath: Secondary | ICD-10-CM | POA: Diagnosis not present

## 2024-01-17 DIAGNOSIS — Z6841 Body Mass Index (BMI) 40.0 and over, adult: Secondary | ICD-10-CM

## 2024-01-17 DIAGNOSIS — R7303 Prediabetes: Secondary | ICD-10-CM | POA: Diagnosis not present

## 2024-01-17 DIAGNOSIS — Z9884 Bariatric surgery status: Secondary | ICD-10-CM

## 2024-01-17 DIAGNOSIS — I10 Essential (primary) hypertension: Secondary | ICD-10-CM | POA: Diagnosis not present

## 2024-01-17 DIAGNOSIS — E669 Obesity, unspecified: Secondary | ICD-10-CM

## 2024-01-17 DIAGNOSIS — E559 Vitamin D deficiency, unspecified: Secondary | ICD-10-CM | POA: Diagnosis not present

## 2024-01-17 DIAGNOSIS — Z0000 Encounter for general adult medical examination without abnormal findings: Secondary | ICD-10-CM

## 2024-01-17 NOTE — Progress Notes (Signed)
 WEIGHT SUMMARY AND BIOMETRICS  Vitals Temp: 98.8 F (37.1 C) BP: 133/78 Pulse Rate: 74 SpO2: 99 %   Anthropometric Measurements Height: 5' 2 (1.575 m) Weight: 237 lb (107.5 kg) BMI (Calculated): 43.34 Weight at Last Visit: 238lb Weight Lost Since Last Visit: 1lb Weight Gained Since Last Visit: 0lb Starting Weight: 280lb Total Weight Loss (lbs): 43 lb (19.5 kg)   Body Composition  Body Fat %: 53.2 % Fat Mass (lbs): 126.4 lbs Muscle Mass (lbs): 105.6 lbs Total Body Water (lbs): 33.7 lbs Visceral Fat Rating : 18   Other Clinical Data Fasting: Yes Labs: Yes Today's Visit #: 7 Starting Date: 05/27/22    Chief Complaint:   OBESITY Kelli Simmons is here to discuss her progress with her obesity treatment plan.  She is on the the Category 1 Plan and states she is following her eating plan approximately 80 % of the time.  She states she is exercising Cardiovascular Exercise/Strength Training 90 minutes 7 times per week.  Interim History: Last OV at [facility] was on 11/11/2023- Fasting Labs completed and recommended to check IC at next OV  She continues to exercise daily at [facility]: 30 mins Cardio 60 mins Strength Training  She has added in daily OTC Creatine 5mg - discussed the risks associated with this supplement- advised to avoid 01/28/2024 Scheduled for STRABISMUS SURGERY, BILATERAL   Subjective:   1. Essential hypertension Discussed Labs 11/11/2023 CMP: Electrolytes, Kidney Fx- stable ALT and AST- normal Slight In Alk Phos minimally elevates at 124 (normal < 121)  She denies RUQ pain  2. Gastroesophageal reflux disease, unspecified whether esophagitis present She denies worsening Acid Reflux sx's  3. S/P laparoscopic sleeve gastrectomy Discussed Labs B12 over replaced, >2000 Vit D level below goal of 50-70,current level 40.6 She is on daily bariatric MVI and OTC Vit D 3 5000 international units gummy  08/06/2021 Surgical Notes: Weight: (!) 142 kg/312 lbs     Hospital Course:  The patient was admitted for a planned laparoscopic sleeve gastrectomy. Please see operative note. Preoperatively the patient was given 5000 units of subcutaneous heparin  for DVT prophylaxis. Postoperative prophylactic Lovenox  dosing was started on the evening of postoperative day 0. ERAS protocol was used. On the evening of postoperative day 0, the patient was started on water and ice chips. On postoperative day 1 the patient had no fever or tachycardia and was tolerating water in their diet was gradually advanced throughout the day. The patient was ambulating without difficulty. Their vital signs are stable without fever or tachycardia. Their hemoglobin had remained stable. The patient had received discharge instructions and counseling. They were deemed stable for discharge and had met discharge criteria   Current weight today: 238 lbs  4. Vitamin D  deficiency Discussed Labs  Latest Reference Range & Units 11/11/23 12:42  Vitamin D , 25-Hydroxy 30.0 - 100.0 ng/mL 40.6   Vit D level below goal of 50-70,current level 40.6 She endorses improved energy levels  5. Prediabetes Discussed Labs Lab Results  Component Value Date   HGBA1C 5.1 11/11/2023   HGBA1C 5.3 09/09/2022   HGBA1C 5.4 05/27/2022    A1c at goal She is not on any antidiabetic medications  6. Healthcare maintenance Discussed Labs  Total and LDL levels both worsened and above goal She has never been on statin therapy She denies premature CAD in her family hx She denies tobacco/vape use  B12 over replaced- she is on daily Bariatric MVI  Thyroid  panel- stable She endorses improved energy levels  Lipid Panel     Component Value Date/Time   CHOL 226 (H) 11/11/2023 1242   TRIG 83 11/11/2023 1242   HDL 86 11/11/2023 1242   CHOLHDL 2.6 11/11/2023 1242   LDLCALC 126 (H) 11/11/2023 1242   LABVLDL 14 11/11/2023 1242    Latest Reference Range & Units 11/11/23 12:42  Vitamin B12 232 - 1245 pg/mL  >2000 (H)  (H): Data is abnormally high   Latest Reference Range & Units 11/11/23 12:42  TSH 0.450 - 4.500 uIU/mL 2.490  T4,Free(Direct) 0.82 - 1.77 ng/dL 8.91   7. SOBE Pt. Endorses dyspnea with extreme exertion, denies CP   05/27/22 08:00  RMR 1613    01/17/24 12:00  RMR 1580   Metabolism slightly decreased and slightly slower that expected.  Assessment/Plan:   1. Essential hypertension (Primary) Stop OTC Creatine supplement  2. Gastroesophageal reflux disease, unspecified whether esophagitis present Elevate your head while sleeping: Raising the head of your bed by 6 to 9 inches can use gravity to help prevent stomach acid from coming up while you sleep.  Avoid tight-fitting clothing: Garments that are tight around the waist can put pressure on your abdomen and contribute to reflux. Manage stress: Chronic stress can increase stomach acid production and worsen symptoms. Stress reduction techniques like meditation, yoga, or deep breathing can be beneficial.  Eat smaller, more frequent meals: Large meals can overload your stomach, making reflux more likely. Eating smaller portions throughout the day can reduce pressure on the LES. Do not lie down after eating: Wait at least two to three hours after eating before lying down. This gives your stomach time to empty and allows gravity to help keep acid down. Identify and avoid trigger foods: Common triggers include:  3. S/P laparoscopic sleeve gastrectomy Continue daily bariatric MVI and OTC Vit D 3 5000 international units gummy   4. Vitamin D  deficiency Continue daily bariatric MVI and OTC Vit D 3 5000 international units gummy   5. Prediabetes Continue Cat 1 MP and regular exercise  6.. Healthcare maintenance Continue daily bariatric MVI and OTC Vit D 3 5000 international units gummy  Discussed Lipid Panel with established PCP at chronic f/u this fall  7.SOBE Check IC Remain on Cat 1 MP Continue regular  exercise  8.Obesity with current BMI 43.6  Braden is currently in the action stage of change. As such, her goal is to continue with weight loss efforts. She has agreed to the Category 1 Plan.   Exercise goals: For substantial health benefits, adults should do at least 150 minutes (2 hours and 30 minutes) a week of moderate-intensity, or 75 minutes (1 hour and 15 minutes) a week of vigorous-intensity aerobic physical activity, or an equivalent combination of moderate- and vigorous-intensity aerobic activity. Aerobic activity should be performed in episodes of at least 10 minutes, and preferably, it should be spread throughout the week.  Behavioral modification strategies: increasing lean protein intake, decreasing simple carbohydrates, increasing vegetables, increasing water intake, no skipping meals, meal planning and cooking strategies, keeping healthy foods in the home, ways to avoid boredom eating, and planning for success.  Jewelz has agreed to follow-up with our clinic in 4 weeks. She was informed of the importance of frequent follow-up visits to maximize her success with intensive lifestyle modifications for her multiple health conditions.   Analese was informed we would discuss her lab results at her next visit unless there is a critical issue that needs to be addressed sooner. Alda agreed to keep her  next visit at the agreed upon time to discuss these results.  Objective:   Blood pressure 133/78, pulse 74, temperature 98.8 F (37.1 C), height 5' 2 (1.575 m), weight 237 lb (107.5 kg), SpO2 99%. Body mass index is 43.35 kg/m.  General: Cooperative, alert, well developed, in no acute distress. HEENT: Conjunctivae and lids unremarkable. Cardiovascular: Regular rhythm.  Lungs: Normal work of breathing. Neurologic: No focal deficits.   Lab Results  Component Value Date   CREATININE 0.74 11/11/2023   BUN 13 11/11/2023   NA 141 11/11/2023   K 4.2 11/11/2023   CL 103 11/11/2023    CO2 22 11/11/2023   Lab Results  Component Value Date   ALT 23 11/11/2023   AST 30 11/11/2023   ALKPHOS 124 (H) 11/11/2023   BILITOT 0.4 11/11/2023   Lab Results  Component Value Date   HGBA1C 5.1 11/11/2023   HGBA1C 5.3 09/09/2022   HGBA1C 5.4 05/27/2022   HGBA1C 5.8 (H) 12/23/2020   HGBA1C 5.0 06/12/2020   Lab Results  Component Value Date   INSULIN  5.8 11/11/2023   INSULIN  7.5 05/27/2022   INSULIN  13.7 12/23/2020   INSULIN  9.7 06/12/2020   INSULIN  14.9 12/20/2019   Lab Results  Component Value Date   TSH 2.490 11/11/2023   Lab Results  Component Value Date   CHOL 226 (H) 11/11/2023   HDL 86 11/11/2023   LDLCALC 126 (H) 11/11/2023   TRIG 83 11/11/2023   CHOLHDL 2.6 11/11/2023   Lab Results  Component Value Date   VD25OH 40.6 11/11/2023   VD25OH 74.9 09/09/2022   VD25OH 75.6 05/27/2022   Lab Results  Component Value Date   WBC 4.2 09/09/2022   HGB 14.3 11/03/2022   HCT 42.0 11/03/2022   MCV 95 05/27/2022   PLT 396 05/27/2022   No results found for: IRON, TIBC, FERRITIN  Attestation Statements:   Reviewed by clinician on day of visit: allergies, medications, problem list, medical history, surgical history, family history, social history, and previous encounter notes.  I have reviewed the above documentation for accuracy and completeness, and I agree with the above. -  Alaira Level d. Doyt Castellana, NP-C

## 2024-01-20 ENCOUNTER — Encounter (HOSPITAL_BASED_OUTPATIENT_CLINIC_OR_DEPARTMENT_OTHER): Payer: Self-pay | Admitting: Ophthalmology

## 2024-01-20 ENCOUNTER — Other Ambulatory Visit: Payer: Self-pay

## 2024-01-26 ENCOUNTER — Encounter (HOSPITAL_BASED_OUTPATIENT_CLINIC_OR_DEPARTMENT_OTHER)
Admission: RE | Admit: 2024-01-26 | Discharge: 2024-01-26 | Disposition: A | Discharge status: Home / Self Care | Source: Ambulatory Visit | Attending: Ophthalmology | Admitting: Ophthalmology

## 2024-01-26 DIAGNOSIS — F419 Anxiety disorder, unspecified: Secondary | ICD-10-CM | POA: Diagnosis not present

## 2024-01-26 DIAGNOSIS — Z87891 Personal history of nicotine dependence: Secondary | ICD-10-CM | POA: Diagnosis not present

## 2024-01-26 DIAGNOSIS — K219 Gastro-esophageal reflux disease without esophagitis: Secondary | ICD-10-CM | POA: Diagnosis not present

## 2024-01-26 DIAGNOSIS — Z79899 Other long term (current) drug therapy: Secondary | ICD-10-CM | POA: Diagnosis not present

## 2024-01-26 DIAGNOSIS — H50332 Intermittent monocular exotropia, left eye: Secondary | ICD-10-CM | POA: Diagnosis present

## 2024-01-26 DIAGNOSIS — H5010 Unspecified exotropia: Secondary | ICD-10-CM | POA: Diagnosis not present

## 2024-01-26 DIAGNOSIS — M459 Ankylosing spondylitis of unspecified sites in spine: Secondary | ICD-10-CM | POA: Diagnosis not present

## 2024-01-26 DIAGNOSIS — I10 Essential (primary) hypertension: Secondary | ICD-10-CM | POA: Diagnosis not present

## 2024-01-27 ENCOUNTER — Ambulatory Visit: Payer: Self-pay | Admitting: Ophthalmology

## 2024-01-27 NOTE — H&P (View-Only) (Signed)
 Date of examination:  01-18-24  Indication for surgery: large angle exotropia with poor control  Pertinent past medical history:  Past Medical History:  Diagnosis Date   Ankylosing spondylitis (HCC)    spine   Back pain    BMI 50.0-59.9, adult (HCC)    GERD (gastroesophageal reflux disease)    Hypertension    Joint pain    Low back pain    Osteoarthritis    knees    Pertinent ocular history:  Longstanding intermittent exotropia with worsening control over years. Patient wishes for alignment.  Pertinent family history:  Family History  Problem Relation Age of Onset   Hypertension Mother    Hyperlipidemia Mother    Thyroid  disease Mother    Depression Mother    Anxiety disorder Mother    Schizophrenia Mother    Obesity Mother    Heart disease Mother    Eating disorder Mother    Sudden death Father    Asthma Neg Hx    Eczema Neg Hx    Allergic Disorder Neg Hx     General:  Healthy appearing patient in no distress.    Eyes:    Acuity OD 20/20  OS 20/30+  cc   External: Within normal limits     Anterior segment: Within normal limits     Motility:   25+30pd LX(T) grade 4  Impression: 63yo female with poorly controlled intermittent exotropia wishing for repair  Plan: BLRc; discussed specifically with patient that maximal effect of surgery matches angle, may need plication/resection in the future for additional correction  M. Ronnald Blanch, MD

## 2024-01-27 NOTE — H&P (Signed)
 Date of examination:  01-18-24  Indication for surgery: large angle exotropia with poor control  Pertinent past medical history:  Past Medical History:  Diagnosis Date   Ankylosing spondylitis (HCC)    spine   Back pain    BMI 50.0-59.9, adult (HCC)    GERD (gastroesophageal reflux disease)    Hypertension    Joint pain    Low back pain    Osteoarthritis    knees    Pertinent ocular history:  Longstanding intermittent exotropia with worsening control over years. Patient wishes for alignment.  Pertinent family history:  Family History  Problem Relation Age of Onset   Hypertension Mother    Hyperlipidemia Mother    Thyroid  disease Mother    Depression Mother    Anxiety disorder Mother    Schizophrenia Mother    Obesity Mother    Heart disease Mother    Eating disorder Mother    Sudden death Father    Asthma Neg Hx    Eczema Neg Hx    Allergic Disorder Neg Hx     General:  Healthy appearing patient in no distress.    Eyes:    Acuity OD 20/20  OS 20/30+  cc   External: Within normal limits     Anterior segment: Within normal limits     Motility:   25+30pd LX(T) grade 4  Impression: 63yo female with poorly controlled intermittent exotropia wishing for repair  Plan: BLRc; discussed specifically with patient that maximal effect of surgery matches angle, may need plication/resection in the future for additional correction  M. Ronnald Blanch, MD

## 2024-01-28 ENCOUNTER — Encounter (HOSPITAL_BASED_OUTPATIENT_CLINIC_OR_DEPARTMENT_OTHER): Payer: Self-pay | Admitting: Ophthalmology

## 2024-01-28 ENCOUNTER — Other Ambulatory Visit: Payer: Self-pay

## 2024-01-28 ENCOUNTER — Ambulatory Visit (HOSPITAL_BASED_OUTPATIENT_CLINIC_OR_DEPARTMENT_OTHER): Admitting: Anesthesiology

## 2024-01-28 ENCOUNTER — Encounter (HOSPITAL_BASED_OUTPATIENT_CLINIC_OR_DEPARTMENT_OTHER)
Admission: RE | Disposition: A | Discharge status: Home / Self Care | Payer: Self-pay | Source: Home / Self Care | Attending: Ophthalmology

## 2024-01-28 ENCOUNTER — Ambulatory Visit (HOSPITAL_BASED_OUTPATIENT_CLINIC_OR_DEPARTMENT_OTHER)
Admission: RE | Admit: 2024-01-28 | Discharge: 2024-01-28 | Disposition: A | Discharge status: Home / Self Care | Attending: Ophthalmology | Admitting: Ophthalmology

## 2024-01-28 DIAGNOSIS — Z87891 Personal history of nicotine dependence: Secondary | ICD-10-CM | POA: Diagnosis not present

## 2024-01-28 DIAGNOSIS — F419 Anxiety disorder, unspecified: Secondary | ICD-10-CM

## 2024-01-28 DIAGNOSIS — I10 Essential (primary) hypertension: Secondary | ICD-10-CM | POA: Insufficient documentation

## 2024-01-28 DIAGNOSIS — Z01818 Encounter for other preprocedural examination: Secondary | ICD-10-CM

## 2024-01-28 DIAGNOSIS — K219 Gastro-esophageal reflux disease without esophagitis: Secondary | ICD-10-CM | POA: Insufficient documentation

## 2024-01-28 DIAGNOSIS — H5010 Unspecified exotropia: Secondary | ICD-10-CM | POA: Insufficient documentation

## 2024-01-28 DIAGNOSIS — M459 Ankylosing spondylitis of unspecified sites in spine: Secondary | ICD-10-CM | POA: Insufficient documentation

## 2024-01-28 DIAGNOSIS — Z79899 Other long term (current) drug therapy: Secondary | ICD-10-CM | POA: Insufficient documentation

## 2024-01-28 HISTORY — PX: STRABISMUS SURGERY: SHX218

## 2024-01-28 SURGERY — STRABISMUS SURGERY, BILATERAL
Anesthesia: General | Site: Eye | Laterality: Bilateral

## 2024-01-28 MED ORDER — PROPOFOL 10 MG/ML IV BOLUS
INTRAVENOUS | Status: DC | PRN
Start: 2024-01-28 — End: 2024-01-28
  Administered 2024-01-28: 200 mg via INTRAVENOUS

## 2024-01-28 MED ORDER — KETOROLAC TROMETHAMINE 30 MG/ML IJ SOLN
INTRAMUSCULAR | Status: AC
  Filled 2024-01-28: qty 1

## 2024-01-28 MED ORDER — TOBRAMYCIN-DEXAMETHASONE 0.3-0.1 % OP SUSP: 1.0000 [drp] | Freq: Four times a day (QID) | OPHTHALMIC | Status: AC

## 2024-01-28 MED ORDER — LIDOCAINE 2% (20 MG/ML) 5 ML SYRINGE
INTRAMUSCULAR | Status: DC | PRN
  Administered 2024-01-28: 60 mg via INTRAVENOUS

## 2024-01-28 MED ORDER — ACETAMINOPHEN 500 MG PO TABS
ORAL_TABLET | ORAL | Status: AC
  Filled 2024-01-28: qty 2

## 2024-01-28 MED ORDER — ONDANSETRON HCL 4 MG/2ML IJ SOLN
INTRAMUSCULAR | Status: DC | PRN
  Administered 2024-01-28: 4 mg via INTRAVENOUS

## 2024-01-28 MED ORDER — FENTANYL CITRATE (PF) 100 MCG/2ML IJ SOLN
INTRAMUSCULAR | Status: AC
  Filled 2024-01-28: qty 2

## 2024-01-28 MED ORDER — MIDAZOLAM HCL 2 MG/2ML IJ SOLN
INTRAMUSCULAR | Status: AC
  Filled 2024-01-28: qty 2

## 2024-01-28 MED ORDER — GLYCOPYRROLATE 0.2 MG/ML IJ SOLN
INTRAMUSCULAR | Status: DC | PRN
  Administered 2024-01-28: .2 mg via INTRAVENOUS

## 2024-01-28 MED ORDER — KETOROLAC TROMETHAMINE 30 MG/ML IJ SOLN
INTRAMUSCULAR | Status: DC | PRN
Start: 2024-01-28 — End: 2024-01-28
  Administered 2024-01-28: 30 mg via INTRAVENOUS

## 2024-01-28 MED ORDER — BUPIVACAINE HCL (PF) 0.5 % IJ SOLN
INTRAMUSCULAR | Status: DC | PRN
  Administered 2024-01-28: 3 mL

## 2024-01-28 MED ORDER — LIDOCAINE 2% (20 MG/ML) 5 ML SYRINGE
INTRAMUSCULAR | Status: AC
  Filled 2024-01-28: qty 5

## 2024-01-28 MED ORDER — OXYCODONE HCL 5 MG/5ML PO SOLN: 5.0000 mg | Freq: Once | ORAL | Status: DC | PRN

## 2024-01-28 MED ORDER — LACTATED RINGERS IV SOLN: INTRAVENOUS | Status: DC

## 2024-01-28 MED ORDER — ONDANSETRON HCL 4 MG/2ML IJ SOLN
INTRAMUSCULAR | Status: AC
  Filled 2024-01-28: qty 2

## 2024-01-28 MED ORDER — FENTANYL CITRATE (PF) 100 MCG/2ML IJ SOLN: 25.0000 ug | INTRAMUSCULAR | Status: DC | PRN

## 2024-01-28 MED ORDER — NEOMYCIN-POLYMYXIN-DEXAMETH 3.5-10000-0.1 OP OINT
TOPICAL_OINTMENT | OPHTHALMIC | Status: DC | PRN
  Administered 2024-01-28: 1 via OPHTHALMIC

## 2024-01-28 MED ORDER — AMISULPRIDE (ANTIEMETIC) 5 MG/2ML IV SOLN: 10.0000 mg | Freq: Once | INTRAVENOUS | Status: DC | PRN

## 2024-01-28 MED ORDER — SODIUM CHLORIDE 0.9 % IV SOLN: 12.5000 mg | INTRAVENOUS | Status: DC | PRN

## 2024-01-28 MED ORDER — DEXAMETHASONE SODIUM PHOSPHATE 4 MG/ML IJ SOLN
INTRAMUSCULAR | Status: DC | PRN
  Administered 2024-01-28: 10 mg via INTRAVENOUS

## 2024-01-28 MED ORDER — OXYCODONE HCL 5 MG PO TABS: 5.0000 mg | ORAL_TABLET | Freq: Once | ORAL | Status: DC | PRN

## 2024-01-28 MED ORDER — PROPOFOL 10 MG/ML IV BOLUS
INTRAVENOUS | Status: AC
  Filled 2024-01-28: qty 20

## 2024-01-28 MED ORDER — FENTANYL CITRATE (PF) 100 MCG/2ML IJ SOLN
INTRAMUSCULAR | Status: DC | PRN
  Administered 2024-01-28 (×2): 50 ug via INTRAVENOUS

## 2024-01-28 MED ORDER — MIDAZOLAM HCL 5 MG/5ML IJ SOLN
INTRAMUSCULAR | Status: DC | PRN
  Administered 2024-01-28: 2 mg via INTRAVENOUS

## 2024-01-28 MED ORDER — BSS IO SOLN
INTRAOCULAR | Status: DC | PRN
  Administered 2024-01-28: 15 mL via INTRAOCULAR

## 2024-01-28 MED ORDER — PHENYLEPHRINE HCL 2.5 % OP SOLN
1.0000 [drp] | Freq: Once | OPHTHALMIC | Status: AC
  Administered 2024-01-28: 1 [drp] via OPHTHALMIC

## 2024-01-28 MED ORDER — DEXAMETHASONE SODIUM PHOSPHATE 10 MG/ML IJ SOLN
INTRAMUSCULAR | Status: AC
  Filled 2024-01-28: qty 1

## 2024-01-28 MED ORDER — ACETAMINOPHEN 500 MG PO TABS: 1000.0000 mg | ORAL_TABLET | Freq: Once | ORAL | Status: DC

## 2024-01-28 MED ORDER — LIDOCAINE HCL 1 % IJ SOLN
INTRAMUSCULAR | Status: AC
  Filled 2024-01-28: qty 20

## 2024-01-28 SURGICAL SUPPLY — 24 items
APPLICATOR COTTON TIP 6 STRL (MISCELLANEOUS) ×4 IMPLANT
APPLICATOR DR MATTHEWS STRL (MISCELLANEOUS) ×1 IMPLANT
BNDG EYE OVAL 2 1/8 X 2 5/8 (GAUZE/BANDAGES/DRESSINGS) IMPLANT
CAUTERY EYE LOW TEMP 1300F FIN (OPHTHALMIC RELATED) IMPLANT
CORD BIPOLAR FORCEPS 12FT (ELECTRODE) ×1 IMPLANT
COVER BACK TABLE 60X90IN (DRAPES) ×1 IMPLANT
COVER MAYO STAND STRL (DRAPES) ×1 IMPLANT
DRAPE SURG 17X23 STRL (DRAPES) IMPLANT
DRAPE U-SHAPE 76X120 STRL (DRAPES) ×1 IMPLANT
GLOVE BIO SURGEON STRL SZ 6.5 (GLOVE) ×1 IMPLANT
GLOVE BIO SURGEON STRL SZ7 (GLOVE) ×1 IMPLANT
GOWN STRL REUS W/ TWL LRG LVL3 (GOWN DISPOSABLE) ×2 IMPLANT
NS IRRIG 1000ML POUR BTL (IV SOLUTION) ×1 IMPLANT
PACK BASIN DAY SURGERY FS (CUSTOM PROCEDURE TRAY) ×1 IMPLANT
SHIELD EYE MED CORNL SHD 22X21 (OPHTHALMIC RELATED) IMPLANT
SPEAR EYE SURG WECK-CEL (MISCELLANEOUS) ×1 IMPLANT
STRIP CLOSURE SKIN 1/4X4 (GAUZE/BANDAGES/DRESSINGS) IMPLANT
SUT CHROMIC 7 0 TG140 8 (SUTURE) ×1 IMPLANT
SUT VICRYL 6 0 S 28 (SUTURE) IMPLANT
SUT VICRYL ABS 6-0 S29 18IN (SUTURE) IMPLANT
SYR 10ML LL (SYRINGE) ×1 IMPLANT
SYR 3ML 23GX1 SAFETY (SYRINGE) ×2 IMPLANT
TOWEL GREEN STERILE FF (TOWEL DISPOSABLE) ×1 IMPLANT
TRAY DSU PREP LF (CUSTOM PROCEDURE TRAY) ×1 IMPLANT

## 2024-01-28 NOTE — Transfer of Care (Signed)
 Immediate Anesthesia Transfer of Care Note  Patient: Kelli Simmons  Procedure(s) Performed: STRABISMUS SURGERY, BILATERAL (Bilateral: Eye)  Patient Location: PACU  Anesthesia Type:General  Level of Consciousness: awake, alert , and oriented  Airway & Oxygen Therapy: Patient Spontanous Breathing and Patient connected to face mask oxygen  Post-op Assessment: Report given to RN and Post -op Vital signs reviewed and stable  Post vital signs: Reviewed and stable  Last Vitals:  Vitals Value Taken Time  BP 122/69 01/28/24 11:28  Temp    Pulse 81 01/28/24 11:28  Resp 22 01/28/24 11:28  SpO2 99 % 01/28/24 11:28  Vitals shown include unfiled device data.  Last Pain:  Vitals:   01/28/24 0917  TempSrc: Temporal  PainSc: 0-No pain      Patients Stated Pain Goal: 3 (01/28/24 0917)  Complications: No notable events documented.

## 2024-01-28 NOTE — Op Note (Signed)
 01/28/2024  11:21 AM  PATIENT:  Kelli Simmons  63 y.o. female  PRE-OPERATIVE DIAGNOSIS:  Exotropia      POST-OPERATIVE DIAGNOSIS:  Exotropia     PROCEDURE:  Lateral rectus muscle recession 10mm both  SURGEON:  [place] Ronnald Blanch, M.D.   ANESTHESIA: General LMA and local subTenons bupivicaine  COMPLICATIONS: None immediate  DESCRIPTION OF PROCEDURE: The patient was taken to the operating room where She was identified by me. General anesthesia was induced without difficulty after placement of appropriate monitors. The patient was prepped and draped in the usual sterile ophthalmic fashion. Maxitrol eye ointment was placed in both eyes for corneal protection during the case.  A lid speculum was placed in the right eye. Forced ductions were unremarkable. Through an inferotemporal fornix incision through conjunctiva and Tenon's fascia, the right lateral rectus muscle was engaged on a series of muscle hooks and cleared of its fascial attachments. The tendon was secured with a double-armed 6-0 Vicryl suture with a double locking bite at each border of the muscle, 1 mm from the insertion. The muscle was disinserted, and was reattached to sclera at a measured distance of 10 millimeters posterior to the original insertion, using direct scleral passes in crossed swords fashion.  The suture ends were tied securely after the position of the muscle had been checked and found to be accurate. 1.5mL of bupivacaine  0.5% was diffused into the sub-Tenons space for perioperative anesthesia. Conjunctiva was closed with 2 7-0 Chromic sutures.  The speculum was transferred to the left eye, where an identical procedure was performed, again effecting a 10 millimeters recession of the lateral rectus muscle.  Two drops of dilute betadine  were placed on each eye and rinsed after ten seconds; Maxitrol ointment was placed in each eye. The patient was awakened without difficulty and taken to the recovery room in stable  condition, having suffered no intraoperative or immediate postoperative complications.  [name] Ronnald Blanch CHRISTELLA.D.

## 2024-01-28 NOTE — Discharge Instructions (Addendum)
 Diet: Clear liquids, advance to soft foods then regular diet as tolerated.  Pain control: Overlapping ibuprofen  (aka Advil , Motrin ) with acetaminophen  (aka Tylenol , Excedrin) has been clinically proven as effective for pain as morphine.  1)  Ibuprofen  600 mg by mouth every 6 hours as needed for pain   Do not take this medication if you already take NSAIDs (such as naproxen/Aleve or Surveyor, quantity).  2)  Acetaminophen  325 one or two by mouth every 4-6 hours as needed for pain that is not resolved by ibuprofen    Do not take this medication if you already take acetaminophen  within another medication (such as Percocet/Lortab).  Eye medications:  Antibiotic eye drops or ointment, one drop or application in the operated eye(s) 4 times a day for 7 days.    Activity: No swimming for 1 week. It is OK to let water run over the face and eyes while showering or taking a bath, even during the first week.  No other restriction on exercise or activity.  Eye movement: The eyes may look very slightly crossed in or turned out, and you may experience temporary double vision (diplopia). This is not unusual postoperatively and may happen up to two months after surgery while the muscles are healing. The eyes may be tired during the first few weeks after surgery; reading can be uncomfortable during the healing process but will not hurt the eyes.  Call Dr. Anthony office [phone] with any problems or concerns.   You took tylenol  at home at 0700, next dose may be at 1pm No ibuprofen  until 5:15pm   Post Anesthesia Home Care Instructions  Activity: Get plenty of rest for the remainder of the day. A responsible individual must stay with you for 24 hours following the procedure.  For the next 24 hours, DO NOT: -Drive a car -Advertising copywriter -Drink alcoholic beverages -Take any medication unless instructed by your physician -Make any legal decisions or sign important papers.  Meals: Start with liquid  foods such as gelatin or soup. Progress to regular foods as tolerated. Avoid greasy, spicy, heavy foods. If nausea and/or vomiting occur, drink only clear liquids until the nausea and/or vomiting subsides. Call your physician if vomiting continues.  Special Instructions/Symptoms: Your throat may feel dry or sore from the anesthesia or the breathing tube placed in your throat during surgery. If this causes discomfort, gargle with warm salt water. The discomfort should disappear within 24 hours.  If you had a scopolamine  patch placed behind your ear for the management of post- operative nausea and/or vomiting:  1. The medication in the patch is effective for 72 hours, after which it should be removed.  Wrap patch in a tissue and discard in the trash. Wash hands thoroughly with soap and water. 2. You may remove the patch earlier than 72 hours if you experience unpleasant side effects which may include dry mouth, dizziness or visual disturbances. 3. Avoid touching the patch. Wash your hands with soap and water after contact with the patch.

## 2024-01-28 NOTE — Interval H&P Note (Signed)
 History and Physical Interval Note:  01/28/2024 9:59 AM  Kelli Simmons  has presented today for surgery, with the diagnosis of intermittent monocular exotropia left eye.  The various methods of treatment have been discussed with the patient and family. After consideration of risks, benefits and other options for treatment, the patient has consented to  Procedure(s): STRABISMUS SURGERY, BILATERAL (Bilateral) as a surgical intervention.  The patient's history has been reviewed, patient examined, no change in status, stable for surgery.  I have reviewed the patient's chart and labs.  Questions were answered to the patient's satisfaction.     [place] Blanch

## 2024-01-28 NOTE — Anesthesia Procedure Notes (Signed)
 Procedure Name: LMA Insertion Date/Time: 01/28/2024 10:35 AM  Performed by: Julieanne Fairy BROCKS, CRNAPre-anesthesia Checklist: Patient identified, Emergency Drugs available, Suction available and Patient being monitored Patient Re-evaluated:Patient Re-evaluated prior to induction Oxygen Delivery Method: Circle system utilized Preoxygenation: Pre-oxygenation with 100% oxygen Induction Type: IV induction Ventilation: Mask ventilation without difficulty LMA: LMA flexible inserted LMA Size: 4.0 Number of attempts: 1 Airway Equipment and Method: Bite block Placement Confirmation: positive ETCO2 Tube secured with: Tape Dental Injury: Teeth and Oropharynx as per pre-operative assessment

## 2024-01-28 NOTE — Anesthesia Preprocedure Evaluation (Addendum)
 Anesthesia Evaluation  Patient identified by MRN, date of birth, ID band Patient awake    Reviewed: Allergy & Precautions, NPO status , Patient's Chart, lab work & pertinent test results  History of Anesthesia Complications Negative for: history of anesthetic complications  Airway Mallampati: I  TM Distance: >3 FB Neck ROM: Full    Dental  (+) Dental Advisory Given, Teeth Intact   Pulmonary former smoker   Pulmonary exam normal        Cardiovascular hypertension, Pt. on medications Normal cardiovascular exam     Neuro/Psych  PSYCHIATRIC DISORDERS Anxiety      Neuromuscular disease    GI/Hepatic Neg liver ROS,GERD  Medicated and Controlled,,  Endo/Other    Class 3 obesity  Renal/GU negative Renal ROS     Musculoskeletal  (+) Arthritis , Osteoarthritis,   Ankylosing spondylitis    Abdominal   Peds  Hematology negative hematology ROS (+)   Anesthesia Other Findings   Reproductive/Obstetrics                              Anesthesia Physical Anesthesia Plan  ASA: 3  Anesthesia Plan: General   Post-op Pain Management: Tylenol  PO (pre-op)*   Induction: Intravenous  PONV Risk Score and Plan: 3 and Treatment may vary due to age or medical condition, Ondansetron , Dexamethasone  and Midazolam   Airway Management Planned: LMA  Additional Equipment: None  Intra-op Plan:   Post-operative Plan: Extubation in OR  Informed Consent: I have reviewed the patients History and Physical, chart, labs and discussed the procedure including the risks, benefits and alternatives for the proposed anesthesia with the patient or authorized representative who has indicated his/her understanding and acceptance.     Dental advisory given  Plan Discussed with: CRNA and Anesthesiologist  Anesthesia Plan Comments:          Anesthesia Quick Evaluation

## 2024-01-28 NOTE — Anesthesia Postprocedure Evaluation (Signed)
 Anesthesia Post Note  Patient: Kelli Simmons  Procedure(s) Performed: STRABISMUS SURGERY, BILATERAL (Bilateral: Eye)     Patient location during evaluation: PACU Anesthesia Type: General Level of consciousness: awake and alert Pain management: pain level controlled Vital Signs Assessment: post-procedure vital signs reviewed and stable Respiratory status: spontaneous breathing, nonlabored ventilation and respiratory function stable Cardiovascular status: stable and blood pressure returned to baseline Anesthetic complications: no   No notable events documented.  Last Vitals:  Vitals:   01/28/24 1145 01/28/24 1200  BP: 116/68 117/67  Pulse: 75 74  Resp: 18 19  Temp:    SpO2: 96% 94%    Last Pain:  Vitals:   01/28/24 1145  TempSrc:   PainSc: 0-No pain                 Debby FORBES Like

## 2024-01-29 ENCOUNTER — Encounter (HOSPITAL_BASED_OUTPATIENT_CLINIC_OR_DEPARTMENT_OTHER): Payer: Self-pay | Admitting: Ophthalmology

## 2024-02-15 ENCOUNTER — Ambulatory Visit (INDEPENDENT_AMBULATORY_CARE_PROVIDER_SITE_OTHER): Admitting: Adult Health

## 2024-05-30 ENCOUNTER — Ambulatory Visit (HOSPITAL_BASED_OUTPATIENT_CLINIC_OR_DEPARTMENT_OTHER): Admitting: Family Medicine

## 2024-07-12 ENCOUNTER — Ambulatory Visit (HOSPITAL_BASED_OUTPATIENT_CLINIC_OR_DEPARTMENT_OTHER): Admitting: Family Medicine
